# Patient Record
Sex: Female | Born: 1950 | Race: White | Hispanic: No | Marital: Married | Smoking: Former smoker
Health system: Southern US, Community
[De-identification: ages and names within clinical notes are randomized; demographics above are authoritative.]

## PROBLEM LIST (undated history)

## (undated) DIAGNOSIS — E785 Hyperlipidemia, unspecified: Secondary | ICD-10-CM

## (undated) DIAGNOSIS — Z860100 Personal history of colon polyps, unspecified: Secondary | ICD-10-CM

## (undated) DIAGNOSIS — E039 Hypothyroidism, unspecified: Secondary | ICD-10-CM

## (undated) DIAGNOSIS — M48062 Spinal stenosis, lumbar region with neurogenic claudication: Secondary | ICD-10-CM

## (undated) DIAGNOSIS — I10 Essential (primary) hypertension: Secondary | ICD-10-CM

## (undated) DIAGNOSIS — M1990 Unspecified osteoarthritis, unspecified site: Secondary | ICD-10-CM

## (undated) DIAGNOSIS — K219 Gastro-esophageal reflux disease without esophagitis: Secondary | ICD-10-CM

## (undated) DIAGNOSIS — Z8489 Family history of other specified conditions: Secondary | ICD-10-CM

## (undated) DIAGNOSIS — C801 Malignant (primary) neoplasm, unspecified: Secondary | ICD-10-CM

## (undated) DIAGNOSIS — K589 Irritable bowel syndrome without diarrhea: Secondary | ICD-10-CM

## (undated) DIAGNOSIS — K2270 Barrett's esophagus without dysplasia: Secondary | ICD-10-CM

## (undated) DIAGNOSIS — D099 Carcinoma in situ, unspecified: Secondary | ICD-10-CM

## (undated) HISTORY — PX: CHOLECYSTECTOMY: SHX55

## (undated) HISTORY — DX: Carcinoma in situ, unspecified: D09.9

## (undated) HISTORY — DX: Irritable bowel syndrome, unspecified: K58.9

## (undated) HISTORY — PX: OTHER SURGICAL HISTORY: SHX169

## (undated) HISTORY — DX: Unspecified osteoarthritis, unspecified site: M19.90

## (undated) HISTORY — DX: Personal history of colonic polyps: Z86.010

## (undated) HISTORY — DX: Personal history of colon polyps, unspecified: Z86.0100

## (undated) HISTORY — PX: TUBAL LIGATION: SHX77

## (undated) HISTORY — PX: APPENDECTOMY: SHX54

---

## 1988-08-06 HISTORY — PX: BREAST BIOPSY: SHX20

## 2006-09-10 ENCOUNTER — Ambulatory Visit: Payer: Self-pay | Admitting: Family Medicine

## 2007-08-19 HISTORY — PX: OTHER SURGICAL HISTORY: SHX169

## 2007-09-16 ENCOUNTER — Ambulatory Visit: Payer: Self-pay | Admitting: Family Medicine

## 2008-09-21 ENCOUNTER — Ambulatory Visit: Payer: Self-pay | Admitting: Family Medicine

## 2008-11-11 ENCOUNTER — Ambulatory Visit: Payer: Self-pay | Admitting: Unknown Physician Specialty

## 2009-10-13 ENCOUNTER — Ambulatory Visit: Payer: Self-pay | Admitting: Internal Medicine

## 2009-11-23 ENCOUNTER — Ambulatory Visit: Payer: Self-pay | Admitting: Internal Medicine

## 2010-11-30 ENCOUNTER — Ambulatory Visit: Payer: Self-pay | Admitting: Internal Medicine

## 2010-12-29 ENCOUNTER — Other Ambulatory Visit: Payer: Self-pay | Admitting: Internal Medicine

## 2011-05-22 ENCOUNTER — Ambulatory Visit: Payer: Self-pay | Admitting: Unknown Physician Specialty

## 2011-06-12 ENCOUNTER — Encounter: Payer: Self-pay | Admitting: Internal Medicine

## 2011-07-07 ENCOUNTER — Encounter: Payer: Self-pay | Admitting: Internal Medicine

## 2011-08-07 ENCOUNTER — Encounter: Payer: Self-pay | Admitting: Internal Medicine

## 2011-11-01 ENCOUNTER — Ambulatory Visit: Payer: Self-pay | Admitting: Gastroenterology

## 2011-12-21 ENCOUNTER — Ambulatory Visit: Payer: Self-pay | Admitting: Internal Medicine

## 2011-12-25 ENCOUNTER — Ambulatory Visit: Payer: Self-pay | Admitting: Internal Medicine

## 2012-07-10 ENCOUNTER — Ambulatory Visit: Payer: Self-pay | Admitting: Internal Medicine

## 2013-01-01 ENCOUNTER — Ambulatory Visit: Payer: Self-pay | Admitting: Internal Medicine

## 2013-04-13 ENCOUNTER — Encounter: Payer: Self-pay | Admitting: Family Medicine

## 2013-05-06 ENCOUNTER — Encounter: Payer: Self-pay | Admitting: Family Medicine

## 2013-05-06 ENCOUNTER — Ambulatory Visit: Payer: Self-pay | Admitting: Neurology

## 2013-05-06 LAB — CREATININE, SERUM: Creatinine: 0.77 mg/dL (ref 0.60–1.30)

## 2013-05-21 ENCOUNTER — Ambulatory Visit: Payer: Self-pay | Admitting: Family Medicine

## 2013-06-06 ENCOUNTER — Encounter: Payer: Self-pay | Admitting: Family Medicine

## 2013-07-06 ENCOUNTER — Encounter: Payer: Self-pay | Admitting: Family Medicine

## 2013-08-06 ENCOUNTER — Encounter: Payer: Self-pay | Admitting: Family Medicine

## 2014-01-07 ENCOUNTER — Ambulatory Visit: Payer: Self-pay | Admitting: Family Medicine

## 2014-12-13 ENCOUNTER — Other Ambulatory Visit: Payer: Self-pay

## 2014-12-13 DIAGNOSIS — Z1231 Encounter for screening mammogram for malignant neoplasm of breast: Secondary | ICD-10-CM

## 2015-01-12 ENCOUNTER — Ambulatory Visit
Admission: RE | Admit: 2015-01-12 | Discharge: 2015-01-12 | Disposition: A | Discharge status: Home / Self Care | Payer: Medicare Other | Source: Ambulatory Visit | Attending: Diagnostic Radiology | Admitting: Diagnostic Radiology

## 2015-01-12 DIAGNOSIS — Z1231 Encounter for screening mammogram for malignant neoplasm of breast: Secondary | ICD-10-CM | POA: Diagnosis present

## 2015-01-12 HISTORY — DX: Malignant (primary) neoplasm, unspecified: C80.1

## 2015-03-25 ENCOUNTER — Ambulatory Visit: Payer: Medicare Other | Attending: Orthopedic Surgery | Admitting: Physical Therapy

## 2015-03-25 DIAGNOSIS — M25562 Pain in left knee: Secondary | ICD-10-CM | POA: Insufficient documentation

## 2015-03-25 DIAGNOSIS — G8929 Other chronic pain: Secondary | ICD-10-CM

## 2015-03-25 DIAGNOSIS — M545 Low back pain, unspecified: Secondary | ICD-10-CM

## 2015-03-25 DIAGNOSIS — M6281 Muscle weakness (generalized): Secondary | ICD-10-CM

## 2015-03-25 NOTE — Therapy (Signed)
[facility] [address], [place], [address] Phone: [phone]   Fax:  [phone]  Physical Therapy Treatment  Patient Details  Name: Joy Hernandez MRN: [id number] Date of Birth: 08/23/1950 Referring Provider:  Thornton Park, MD  Encounter Date: 03/25/2015      PT End of Session - 03/25/15 0937    Visit Number 1   Number of Visits 9   Date for PT Re-Evaluation 05/07/15   Authorization Type gcode   Authorization - Visit Number 1   Authorization - Number of Visits 10   PT Start Time 0830   PT Stop Time 0076   PT Time Calculation (min) 67 min   Activity Tolerance Patient tolerated treatment well;No increased pain   Behavior During Therapy [facility] for tasks assessed/performed      Past Medical History  Diagnosis Date  . Cancer     appendix    Past Surgical History  Procedure Laterality Date  . Breast biopsy Left 1990    needle bx- neg    There were no vitals filed for this visit.  Visit Diagnosis:  Knee pain, chronic, left  Muscle weakness  Left-sided low back pain without sciatica      Subjective Assessment - 03/25/15 0833    Subjective Pt reports she has had chronic left knee pain for "years" which had been "nothing bad". Pain began when pt was doing lunges many years ago. Has had on and off again soreness which has worsened over the past year. Pain is behind knee. Pt has pain with getting up and down, sitting for long periods of time.    Limitations Sitting;Walking   How long can you sit comfortably? variable, as little as 5 min.   Diagnostic tests X-rays with mild B arthritis   Patient Stated Goals Pt wants a set of exercises and stretches to manage her symptoms at home.   Currently in Pain? No/denies            [facility] PT Assessment - 03/25/15 0001    Assessment   Medical Diagnosis pain in left knee   Hand Dominance Right   Prior Therapy chiropractic   Balance Screen   Has the  patient fallen in the past 6 months No   Has the patient had a decrease in activity level because of a fear of falling?  No   Is the patient reluctant to leave their home because of a fear of falling?  No   Prior Function   Level of Independence Independent   Vocation Full time employment   Vocation Requirements sitting, walking short to moderate distances, desk work   Leisure walking, working out, spending time with family   Posture/Postural Control   Posture Comments knee valgus B, worse on L. FHP, flat lumbar spine.   ROM / Strength   AROM / PROM / Strength AROM;PROM   AROM   Overall AROM Comments AROM: WNL except B hip ER decr., no pain, L knee extension painful at end range. Decr. strength in L LE to 4/5 most likely pain related except B hip abduction 3/5 with compensatory TFL engagement   PROM   Overall PROM Comments PROM for L knee flexion pain free.   Palpation   Patella mobility L glide painful, Tib/femur mob reproduces ant. pain.    Palpation comment trigger points noted throughout quad, adductor, glutes, QL.   Ambulation/Gait   Gait Comments B trendelenberg, decr. stance time on L.  Objective: Standing with education on wt shift to maintain centration through the joints. Leg lift and hold in this position x5 min.  SL leg lift 3x8 with manual correction to avoid compensation/focus on glute med strengthening.  Patellar mobs 3x30 grade IV med/lat. Tib/femur mobs same grades. Passive end range knee extension 3x20 with 5 sec. Holds.  Following manual tx pt had pain free knee extension in seated.                   PT Education - April 03, 2015 0936    Education provided Yes   Education Details educated pt on HEP (see note)   Person(s) Educated Patient   Methods Explanation;Demonstration;Tactile cues;Verbal cues   Comprehension Verbalized understanding;Returned demonstration;Verbal cues required;Tactile cues required;Need further instruction              PT Long Term Goals - 04/03/15 0940    PT LONG TERM GOAL #1   Title Pt will be able to sit 30 min with no c/o pain in knee to participate in work activities.   Baseline 5 min   Time 6   Period Weeks   Status New   PT LONG TERM GOAL #2   Title Pt will improve MMT 1/2 step from 3/5 to 3+/5 for glute med to decr. compensatory knee strain.   Time 6   Period Weeks   Status New   PT LONG TERM GOAL #3   Title Pt will be able to walk 20 min pain free to improve functional capacity.   Baseline 5 min   Time 6   Period Weeks   Status New               Plan - April 03, 2015 6073    Clinical Impression Statement pt is a pleasant 64 y/o female with c/o chronic L knee pain. Currently pt presents with B trendelenberg gait, muscle weakness in B glute med, hypomobility and edema in L knee, pain and muscle weakness in same region, as well as altered gait. Pt requestes to be issued HEP to make progress on her own so focus of sessions will be on addressing these issues via exercise and stretching but pt may benefit from some manual therapy and dry needling.   Pt will benefit from skilled therapeutic intervention in order to improve on the following deficits Pain;Postural dysfunction;Improper body mechanics;Impaired flexibility;Decreased range of motion;Decreased mobility;Difficulty walking;Decreased strength   Rehab Potential Good   Clinical Impairments Affecting Rehab Potential dizziness, chronic pain.   PT Frequency 1x / week   PT Duration 6 weeks   PT Treatment/Interventions ADLs/Self Care Home Management;Passive range of motion;Therapeutic activities;Therapeutic exercise;Dry needling;Manual techniques   PT Next Visit Plan assess back   Consulted and Agree with Plan of Care Patient          G-Codes - 04-03-15 0942    Functional Assessment Tool Used NPRS, sitting time   Functional Limitation Mobility: Walking and moving around   Mobility: Walking and Moving Around Current Status  ([id number]) At least 1 percent but less than 20 percent impaired, limited or restricted   Mobility: Walking and Moving Around Goal Status [phone]) At least 1 percent but less than 20 percent impaired, limited or restricted      Problem List There are no active problems to display for this patient.   Misty Foutz 2015/04/03, 9:44 AM  [place] [facility] [address] [place], [address] Phone: [phone]   Fax:  [phone]      

## 2015-03-31 ENCOUNTER — Ambulatory Visit
Admission: RE | Admit: 2015-03-31 | Discharge: 2015-03-31 | Disposition: A | Discharge status: Home / Self Care | Payer: Medicare Other | Source: Ambulatory Visit | Attending: Gastroenterology | Admitting: Gastroenterology

## 2015-03-31 ENCOUNTER — Ambulatory Visit: Payer: Medicare Other | Admitting: Anesthesiology

## 2015-03-31 ENCOUNTER — Encounter: Payer: Self-pay | Admitting: *Deleted

## 2015-03-31 ENCOUNTER — Encounter: Payer: Medicare Other | Admitting: Physical Therapy

## 2015-03-31 ENCOUNTER — Encounter
Admission: RE | Disposition: A | Discharge status: Home / Self Care | Payer: Self-pay | Source: Ambulatory Visit | Attending: Gastroenterology

## 2015-03-31 DIAGNOSIS — I10 Essential (primary) hypertension: Secondary | ICD-10-CM | POA: Diagnosis not present

## 2015-03-31 DIAGNOSIS — E785 Hyperlipidemia, unspecified: Secondary | ICD-10-CM | POA: Diagnosis not present

## 2015-03-31 DIAGNOSIS — Z8589 Personal history of malignant neoplasm of other organs and systems: Secondary | ICD-10-CM | POA: Insufficient documentation

## 2015-03-31 DIAGNOSIS — Z79899 Other long term (current) drug therapy: Secondary | ICD-10-CM | POA: Diagnosis not present

## 2015-03-31 DIAGNOSIS — Z7982 Long term (current) use of aspirin: Secondary | ICD-10-CM | POA: Insufficient documentation

## 2015-03-31 DIAGNOSIS — Z803 Family history of malignant neoplasm of breast: Secondary | ICD-10-CM | POA: Diagnosis not present

## 2015-03-31 DIAGNOSIS — F17210 Nicotine dependence, cigarettes, uncomplicated: Secondary | ICD-10-CM | POA: Diagnosis not present

## 2015-03-31 DIAGNOSIS — K2270 Barrett's esophagus without dysplasia: Secondary | ICD-10-CM | POA: Diagnosis not present

## 2015-03-31 DIAGNOSIS — K219 Gastro-esophageal reflux disease without esophagitis: Secondary | ICD-10-CM | POA: Insufficient documentation

## 2015-03-31 HISTORY — DX: Barrett's esophagus without dysplasia: K22.70

## 2015-03-31 HISTORY — DX: Essential (primary) hypertension: I10

## 2015-03-31 HISTORY — DX: Gastro-esophageal reflux disease without esophagitis: K21.9

## 2015-03-31 HISTORY — DX: Hyperlipidemia, unspecified: E78.5

## 2015-03-31 HISTORY — PX: ESOPHAGOGASTRODUODENOSCOPY: SHX5428

## 2015-03-31 SURGERY — EGD (ESOPHAGOGASTRODUODENOSCOPY)
Anesthesia: General

## 2015-03-31 MED ORDER — BUTAMBEN-TETRACAINE-BENZOCAINE 2-2-14 % EX AERO
INHALATION_SPRAY | CUTANEOUS | Status: DC | PRN
  Administered 2015-03-31: 1 via TOPICAL

## 2015-03-31 MED ORDER — MIDAZOLAM HCL 5 MG/5ML IJ SOLN
INTRAMUSCULAR | Status: AC
  Filled 2015-03-31: qty 10

## 2015-03-31 MED ORDER — MIDAZOLAM HCL 5 MG/5ML IJ SOLN
INTRAMUSCULAR | Status: DC | PRN
  Administered 2015-03-31 (×2): 2 mg via INTRAVENOUS

## 2015-03-31 MED ORDER — SODIUM CHLORIDE 0.9 % IV SOLN
INTRAVENOUS | Status: DC
Start: 2015-03-31 — End: 2015-03-31

## 2015-03-31 MED ORDER — FENTANYL CITRATE (PF) 100 MCG/2ML IJ SOLN
INTRAMUSCULAR | Status: AC
  Filled 2015-03-31: qty 4

## 2015-03-31 MED ORDER — FENTANYL CITRATE (PF) 100 MCG/2ML IJ SOLN
INTRAMUSCULAR | Status: DC | PRN
  Administered 2015-03-31 (×2): 50 ug via INTRAVENOUS

## 2015-03-31 MED ORDER — SODIUM CHLORIDE 0.9 % IV SOLN
INTRAVENOUS | Status: DC
Start: 2015-03-31 — End: 2015-03-31
  Administered 2015-03-31: 1000 mL via INTRAVENOUS

## 2015-03-31 NOTE — Anesthesia Preprocedure Evaluation (Deleted)
Anesthesia Evaluation  Patient identified by MRN, date of birth, ID band Patient awake    Reviewed: Allergy & Precautions, NPO status , Patient's Chart, lab work & pertinent test results, reviewed documented beta blocker date and time   Airway Mallampati: II  TM Distance: >3 FB     Dental  (+) Chipped   Pulmonary          Cardiovascular hypertension, Pt. on medications     Neuro/Psych    GI/Hepatic   Endo/Other    Renal/GU      Musculoskeletal   Abdominal   Peds  Hematology   Anesthesia Other Findings   Reproductive/Obstetrics                             Anesthesia Physical Anesthesia Plan  ASA: II  Anesthesia Plan: General   Post-op Pain Management:    Induction: Intravenous  Airway Management Planned: Nasal Cannula  Additional Equipment:   Intra-op Plan:   Post-operative Plan:   Informed Consent: I have reviewed the patients History and Physical, chart, labs and discussed the procedure including the risks, benefits and alternatives for the proposed anesthesia with the patient or authorized representative who has indicated his/her understanding and acceptance.     Plan Discussed with: CRNA  Anesthesia Plan Comments:         Anesthesia Quick Evaluation

## 2015-03-31 NOTE — Op Note (Signed)
[facility] Gastroenterology Patient Name: Joy Hernandez Procedure Date: 03/31/2015 7:29 AM MRN: [id number] Account #: [id number] Date of Birth: Apr 17, 1951 Admit Type: Outpatient Age: 64 Room: [facility] ENDO ROOM 4 Gender: Female Note Status: Finalized Procedure:         Upper GI endoscopy Indications:       Follow-up of Barrett's esophagus Providers:         Lupita Dawn. Candace Cruise, MD Referring MD:      Caprice Renshaw (Referring MD) Medicines:         Midazolam 4 mg IV, Fentanyl 100 micrograms IV, Cetacaine                     spray Complications:     No immediate complications. Procedure:         Pre-Anesthesia Assessment:                    - Prior to the procedure, a History and Physical was                     performed, and patient medications, allergies and                     sensitivities were reviewed. The patient's tolerance of                     previous anesthesia was reviewed.                    - The risks and benefits of the procedure and the sedation                     options and risks were discussed with the patient. All                     questions were answered and informed consent was obtained.                    - After reviewing the risks and benefits, the patient was                     deemed in satisfactory condition to undergo the procedure.                    After obtaining informed consent, the endoscope was passed                     under direct vision. Throughout the procedure, the                     patient's blood pressure, pulse, and oxygen saturations                     were monitored continuously. The Olympus GIF-160 endoscope                     (S#. [id number]) was introduced through the mouth, and                     advanced to the second part of duodenum. The upper GI                     endoscopy was accomplished without difficulty. The patient  tolerated the procedure well. Findings:      The examined esophagus  was normal. Biopsies were taken with a cold       forceps for histology.      The entire examined stomach was normal.      The examined duodenum was normal. Impression:        - Normal esophagus. Biopsied.                    - Normal stomach.                    - Normal examined duodenum. Recommendation:    - Discharge patient to home.                    - Observe patient's clinical course.                    - The findings and recommendations were discussed with the                     patient.                    - Await pathology results.                    - Continue present medications. Procedure Code(s): --- Professional ---                    [phone], Esophagogastroduodenoscopy, flexible, transoral;                     with biopsy, single or multiple Diagnosis Code(s): --- Professional ---                    K22.70, Barrett's esophagus without dysplasia CPT copyright 2014 American Medical Association. All rights reserved. The codes documented in this report are preliminary and upon coder review may  be revised to meet current compliance requirements. Hulen Luster, MD 03/31/2015 8:24:48 AM This report has been signed electronically. Number of Addenda: 0 Note Initiated On: 03/31/2015 7:29 AM      [facility]

## 2015-03-31 NOTE — H&P (Signed)
Primary Care Physician:  Marcello Fennel, MD Primary Gastroenterologist:  Dr. Candace Cruise  Pre-Procedure History & Physical: HPI:  Joy Hernandez is a 64 y.o. female is here for an EGD.   Past Medical History  Diagnosis Date  . Hypertension   . Hyperlipidemia   . GERD (gastroesophageal reflux disease)   . Barrett's esophagus   . Cancer     appendix    Past Surgical History  Procedure Laterality Date  . Breast biopsy Left 1990    needle bx- neg    Prior to Admission medications   Medication Sig Start Date End Date Taking? Authorizing Provider  Ascorbic Acid (VITAMIN C) 1000 MG tablet Take 1,000 mg by mouth daily.   Yes Historical Provider, MD  aspirin 81 MG tablet Take 81 mg by mouth daily.   Yes Historical Provider, MD  bisoprolol-hydrochlorothiazide (ZIAC) 2.5-6.25 MG per tablet Take 1 tablet by mouth daily.   Yes Historical Provider, MD  Fish Oil-Cholecalciferol (FISH OIL + D3 PO) Take by mouth.   Yes Historical Provider, MD  Multiple Vitamin (MULTIVITAMIN) tablet Take 1 tablet by mouth daily.   Yes Historical Provider, MD  Turmeric 500 MG CAPS Take by mouth.   Yes Historical Provider, MD  vitamin B-12 (CYANOCOBALAMIN) 1000 MCG tablet Take 1,000 mcg by mouth daily.   Yes Historical Provider, MD  Vitamin D, Ergocalciferol, (DRISDOL) 50000 UNITS CAPS capsule Take 50,000 Units by mouth every 7 (seven) days.   Yes Historical Provider, MD  escitalopram (LEXAPRO) 10 MG tablet Take 10 mg by mouth daily.    Historical Provider, MD  pantoprazole (PROTONIX) 40 MG tablet Take 40 mg by mouth daily.    Historical Provider, MD  simvastatin (ZOCOR) 40 MG tablet Take 40 mg by mouth daily.    Historical Provider, MD    Allergies as of 01/12/2015  . (Not on File)    Family History  Problem Relation Age of Onset  . Breast cancer Sister 48    Social History   Social History  . Marital Status: Married    Spouse Name: N/A  . Number of Children: N/A  . Years of Education: N/A    Occupational History  . Not on file.   Social History Main Topics  . Smoking status: Current Every Day Smoker  . Smokeless tobacco: Not on file  . Alcohol Use: Not on file  . Drug Use: Not on file  . Sexual Activity: Not on file   Other Topics Concern  . Not on file   Social History Narrative    Review of Systems: See HPI, otherwise negative ROS  Physical Exam: BP 164/67 mmHg  Pulse 61  Temp(Src) 96.1 F (35.6 C) (Tympanic)  Resp 20  Ht 5\' 4"  (1.626 m)  Wt 94.348 kg (208 lb)  BMI 35.69 kg/m2  SpO2 99% General:   Alert,  pleasant and cooperative in NAD Head:  Normocephalic and atraumatic. Neck:  Supple; no masses or thyromegaly. Lungs:  Clear throughout to auscultation.    Heart:  Regular rate and rhythm. Abdomen:  Soft, nontender and nondistended. Normal bowel sounds, without guarding, and without rebound.   Neurologic:  Alert and  oriented x4;  grossly normal neurologically.  Impression/Plan: Joy Hernandez is here for an EGD to be performed for hx of Barrett's. Risks, benefits, limitations, and alternatives regarding EGD have been reviewed with the patient.  Questions have been answered.  All parties agreeable.   Harvie Morua, Lupita Dawn, MD  03/31/2015, 7:58 AM

## 2015-04-01 ENCOUNTER — Ambulatory Visit: Payer: Medicare Other | Admitting: Physical Therapy

## 2015-04-01 ENCOUNTER — Encounter: Payer: Self-pay | Admitting: Gastroenterology

## 2015-04-01 DIAGNOSIS — M25562 Pain in left knee: Secondary | ICD-10-CM | POA: Diagnosis not present

## 2015-04-01 DIAGNOSIS — M6281 Muscle weakness (generalized): Secondary | ICD-10-CM

## 2015-04-01 DIAGNOSIS — G8929 Other chronic pain: Secondary | ICD-10-CM

## 2015-04-01 LAB — SURGICAL PATHOLOGY

## 2015-04-01 NOTE — Therapy (Signed)
[facility] [address] [place], [address] Phone: [phone]   Fax:  [phone]  Physical Therapy Treatment  Patient Details  Name: Joy Hernandez MRN: [id number] Date of Birth: [birth year] Referring Provider:  Thornton Park, MD  Encounter Date: 04/01/2015      PT End of Session - 04/01/15 1116    Visit Number 2   Number of Visits 9   Date for PT Re-Evaluation 05/07/15   Authorization Type gcode   PT Start Time 1115   PT Stop Time 1200   PT Time Calculation (min) 45 min   Activity Tolerance --   Behavior During Therapy --      Past Medical History  Diagnosis Date  . Hypertension   . Hyperlipidemia   . GERD (gastroesophageal reflux disease)   . Barrett's esophagus   . Cancer     appendix    Past Surgical History  Procedure Laterality Date  . Breast biopsy Left 1990    needle bx- neg    There were no vitals filed for this visit.  Visit Diagnosis:  Knee pain, chronic, left  Muscle weakness      Subjective Assessment - 04/01/15 1110    Subjective Pt reports decr. pain. "the only time it hurts is when I bend it back". Pt reports she is doing her exercises.   Limitations Sitting;Walking   How long can you sit comfortably? variable, as little as 5 min.   Diagnostic tests X-rays with mild B arthritis   Patient Stated Goals Pt wants a set of exercises and stretches to manage her symptoms at home.   Currently in Pain? No/denies                    Objective: Reviewed and corrected TA contraction with SLR.  Heel slide with overpressure to facilitate knee flexion 3x10.  Standing calf stretch to decr. Lateral posterior chain tightness 3x1 min on L.  Ball knee oxcillations in pain free state x5 min.  glute set 3x10 with 3 sec. Hold.  glute set with kegel 3x5 with 3 sec. Hold.  Exhale with pelvic activation to decr. Compensatory back activation.  Pt required extensive  cuing with exercises, following session pt reported 0/10 pain but had questions regarding performing these exercises at home, PT will follow up at next session with this.             PT Education - 04/01/15 1115    Education provided Yes   Education Details educated on self quad stretch in prone.   Person(s) Educated Patient   Methods Explanation;Demonstration;Tactile cues;Verbal cues   Comprehension Verbalized understanding;Returned demonstration;Verbal cues required;Tactile cues required;Need further instruction             PT Long Term Goals - 03/25/15 0940    PT LONG TERM GOAL #1   Title Pt will be able to sit 30 min with no c/o pain in knee to participate in work activities.   Baseline 5 min   Time 6   Period Weeks   Status New   PT LONG TERM GOAL #2   Title Pt will improve MMT 1/2 step from 3/5 to 3+/5 for glute med to decr. compensatory knee strain.   Time 6   Period Weeks   Status New   PT LONG TERM GOAL #3   Title Pt will be able to walk 20 min pain free to improve functional capacity.   Baseline  5 min   Time 6   Period Weeks   Status New               Plan - 04/01/15 1116    Clinical Impression Statement Pt has complex orthopedic problem, reported today that in addition to her knee pain she also has pain in lateral gastroc, occasional L sided back pain and L plantar fascitis as well as urinary incontinence issues. Overall pain has improved but pt continues to have mild L sided pain and poor functional control globally on L side.   Pt will benefit from skilled therapeutic intervention in order to improve on the following deficits Pain;Postural dysfunction;Improper body mechanics;Impaired flexibility;Decreased range of motion;Decreased mobility;Difficulty walking;Decreased strength   Rehab Potential Good   Clinical Impairments Affecting Rehab Potential dizziness, chronic pain.   PT Frequency 1x / week   PT Duration 6 weeks   PT  Treatment/Interventions ADLs/Self Care Home Management;Passive range of motion;Therapeutic activities;Therapeutic exercise;Dry needling;Manual techniques        Problem List There are no active problems to display for this patient.   Belvin Gauss 04/01/2015, 12:04 PM  [place] [facility] [address], [place], [address] Phone: [phone]   Fax:  [phone]

## 2015-04-06 ENCOUNTER — Encounter: Payer: Medicare Other | Admitting: Physical Therapy

## 2015-04-08 ENCOUNTER — Ambulatory Visit: Payer: Medicare Other | Attending: Orthopedic Surgery | Admitting: Physical Therapy

## 2015-04-08 DIAGNOSIS — M25562 Pain in left knee: Secondary | ICD-10-CM | POA: Diagnosis not present

## 2015-04-08 DIAGNOSIS — G8929 Other chronic pain: Secondary | ICD-10-CM | POA: Insufficient documentation

## 2015-04-08 DIAGNOSIS — M6281 Muscle weakness (generalized): Secondary | ICD-10-CM | POA: Diagnosis present

## 2015-04-08 NOTE — Therapy (Signed)
[place] [facility] [address] [place], [address] Phone: [phone]   Fax:  [phone]  Physical Therapy Treatment  Patient Details  Name: Joy Hernandez MRN: [id number] Date of Birth: 11-03-1950 Referring Provider:  Thornton Park, MD  Encounter Date: 04/08/2015      PT End of Session - 04/08/15 0925    Visit Number 3   Number of Visits 9   Date for PT Re-Evaluation 05/07/15   Authorization Type gcode   Authorization - Visit Number 3   Authorization - Number of Visits 10   PT Start Time 0745   PT Stop Time 0830   PT Time Calculation (min) 45 min   Activity Tolerance Patient tolerated treatment well;No increased pain   Behavior During Therapy [facility] for tasks assessed/performed      Past Medical History  Diagnosis Date  . Hypertension   . Hyperlipidemia   . GERD (gastroesophageal reflux disease)   . Barrett's esophagus   . Cancer     appendix    Past Surgical History  Procedure Laterality Date  . Breast biopsy Left 1990    needle bx- neg  . Esophagogastroduodenoscopy N/A 03/31/2015    Procedure: ESOPHAGOGASTRODUODENOSCOPY (EGD);  Surgeon: Hulen Luster, MD;  Location: [facility] ENDOSCOPY;  Service: Gastroenterology;  Laterality: N/A;    There were no vitals filed for this visit.  Visit Diagnosis:  Knee pain, chronic, left  Muscle weakness      Subjective Assessment - 04/08/15 0743    Subjective Pt reports both knees are stiff today. overall she is doing very well and has had no pain since prior to prevoius session.   Currently in Pain? No/denies   Pain Score 0-No pain            Objective: Stairs knee flexion stretch performed on 2 levels, 3x1 min on each side at each level.  TG 3x8 squats on highest level, cuing to maintain even weight shift.  Nu-step x3 min to assess for performance/pain, L1   Recumbent bike x3 min to assess for same, no resistance.  PT educated pt on use of nu-step  at local gym for continued improvement in function, strength and to decr. Pain.  SL leg lift  3x8. Pt required cuing to avoid rolling back and compensating with hip flexors.  Patellar lateral glides 3x1 min grade IV, tib/femur glides also 3x1 min grade IV. Performed on both sides due to stiffness B today - pt reported no stiffness following session.                     PT Education - 04/08/15 [phone]    Education provided Yes   Education Details educated pt on exercise equipment, knee stretch.   Person(s) Educated Patient   Methods Explanation;Demonstration;Tactile cues;Verbal cues   Comprehension Verbalized understanding;Returned demonstration;Verbal cues required             PT Long Term Goals - 03/25/15 0940    PT LONG TERM GOAL #1   Title Pt will be able to sit 30 min with no c/o pain in knee to participate in work activities.   Baseline 5 min   Time 6   Period Weeks   Status New   PT LONG TERM GOAL #2   Title Pt will improve MMT 1/2 step from 3/5 to 3+/5 for glute med to decr. compensatory knee strain.   Time 6   Period Weeks   Status New  PT LONG TERM GOAL #3   Title Pt will be able to walk 20 min pain free to improve functional capacity.   Baseline 5 min   Time 6   Period Weeks   Status New               Plan - 04/08/15 8756    Clinical Impression Statement Pt has made improvement, is I with her HEP and appears to be capable of maintaining pain free state with self performance of stretches/exercise. Pt to be seen for one additional followup session to progress glute strengthening as she is still weak in this area and would benefit from additional PT to address this.   Pt will benefit from skilled therapeutic intervention in order to improve on the following deficits Pain;Postural dysfunction;Improper body mechanics;Impaired flexibility;Decreased range of motion;Decreased mobility;Difficulty walking;Decreased strength        Problem  List There are no active problems to display for this patient.   Fisher,Benjamin 04/08/2015, 9:29 AM  [place] [facility] [address], [place], [address] Phone: [phone]   Fax:  [phone]

## 2015-04-13 ENCOUNTER — Encounter: Payer: Medicare Other | Admitting: Physical Therapy

## 2015-04-14 ENCOUNTER — Encounter: Payer: Medicare Other | Admitting: Physical Therapy

## 2015-04-19 ENCOUNTER — Encounter: Payer: Medicare Other | Admitting: Physical Therapy

## 2015-04-22 ENCOUNTER — Encounter: Payer: Medicare Other | Admitting: Physical Therapy

## 2016-01-26 ENCOUNTER — Other Ambulatory Visit: Payer: Self-pay | Admitting: Family Medicine

## 2016-01-26 DIAGNOSIS — Z1231 Encounter for screening mammogram for malignant neoplasm of breast: Secondary | ICD-10-CM

## 2016-02-15 ENCOUNTER — Ambulatory Visit: Payer: Medicare Other

## 2016-03-28 ENCOUNTER — Other Ambulatory Visit: Payer: Self-pay | Admitting: Family Medicine

## 2016-03-28 ENCOUNTER — Ambulatory Visit
Admission: RE | Admit: 2016-03-28 | Discharge: 2016-03-28 | Disposition: A | Discharge status: Home / Self Care | Payer: Medicare Other | Source: Ambulatory Visit | Attending: Family Medicine | Admitting: Family Medicine

## 2016-03-28 DIAGNOSIS — Z1231 Encounter for screening mammogram for malignant neoplasm of breast: Secondary | ICD-10-CM | POA: Insufficient documentation

## 2017-02-21 ENCOUNTER — Other Ambulatory Visit: Payer: Self-pay | Admitting: Family Medicine

## 2017-02-21 DIAGNOSIS — Z1231 Encounter for screening mammogram for malignant neoplasm of breast: Secondary | ICD-10-CM

## 2017-05-13 ENCOUNTER — Ambulatory Visit
Admission: RE | Admit: 2017-05-13 | Discharge: 2017-05-13 | Disposition: A | Discharge status: Home / Self Care | Payer: Medicare Other | Source: Ambulatory Visit | Attending: Family Medicine | Admitting: Family Medicine

## 2017-05-13 DIAGNOSIS — Z1231 Encounter for screening mammogram for malignant neoplasm of breast: Secondary | ICD-10-CM | POA: Diagnosis present

## 2018-04-14 ENCOUNTER — Other Ambulatory Visit: Payer: Self-pay | Admitting: Family Medicine

## 2018-04-14 DIAGNOSIS — Z1231 Encounter for screening mammogram for malignant neoplasm of breast: Secondary | ICD-10-CM

## 2018-05-20 ENCOUNTER — Ambulatory Visit
Admission: RE | Admit: 2018-05-20 | Discharge: 2018-05-20 | Disposition: A | Discharge status: Home / Self Care | Payer: Medicare HMO | Source: Ambulatory Visit | Attending: Family Medicine | Admitting: Family Medicine

## 2018-05-20 ENCOUNTER — Encounter (INDEPENDENT_AMBULATORY_CARE_PROVIDER_SITE_OTHER): Payer: Self-pay

## 2018-05-20 DIAGNOSIS — Z1231 Encounter for screening mammogram for malignant neoplasm of breast: Secondary | ICD-10-CM | POA: Diagnosis not present

## 2018-09-01 ENCOUNTER — Encounter: Payer: Self-pay | Admitting: *Deleted

## 2018-09-02 ENCOUNTER — Ambulatory Visit: Payer: Medicare HMO | Admitting: Anesthesiology

## 2018-09-02 ENCOUNTER — Encounter: Payer: Self-pay | Admitting: *Deleted

## 2018-09-02 ENCOUNTER — Ambulatory Visit
Admission: RE | Admit: 2018-09-02 | Discharge: 2018-09-02 | Disposition: A | Discharge status: Home / Self Care | Payer: Medicare HMO | Attending: Internal Medicine | Admitting: Internal Medicine

## 2018-09-02 ENCOUNTER — Encounter
Admission: RE | Disposition: A | Discharge status: Home / Self Care | Payer: Self-pay | Source: Home / Self Care | Attending: Internal Medicine

## 2018-09-02 DIAGNOSIS — K2980 Duodenitis without bleeding: Secondary | ICD-10-CM | POA: Diagnosis not present

## 2018-09-02 DIAGNOSIS — K2950 Unspecified chronic gastritis without bleeding: Secondary | ICD-10-CM | POA: Diagnosis not present

## 2018-09-02 DIAGNOSIS — K640 First degree hemorrhoids: Secondary | ICD-10-CM | POA: Insufficient documentation

## 2018-09-02 DIAGNOSIS — Z86010 Personal history of colonic polyps: Secondary | ICD-10-CM | POA: Diagnosis not present

## 2018-09-02 DIAGNOSIS — D120 Benign neoplasm of cecum: Secondary | ICD-10-CM | POA: Diagnosis not present

## 2018-09-02 DIAGNOSIS — I10 Essential (primary) hypertension: Secondary | ICD-10-CM | POA: Insufficient documentation

## 2018-09-02 DIAGNOSIS — K5730 Diverticulosis of large intestine without perforation or abscess without bleeding: Secondary | ICD-10-CM | POA: Insufficient documentation

## 2018-09-02 DIAGNOSIS — Z79899 Other long term (current) drug therapy: Secondary | ICD-10-CM | POA: Diagnosis not present

## 2018-09-02 DIAGNOSIS — E785 Hyperlipidemia, unspecified: Secondary | ICD-10-CM | POA: Diagnosis not present

## 2018-09-02 DIAGNOSIS — Z7951 Long term (current) use of inhaled steroids: Secondary | ICD-10-CM | POA: Insufficient documentation

## 2018-09-02 DIAGNOSIS — Z7982 Long term (current) use of aspirin: Secondary | ICD-10-CM | POA: Insufficient documentation

## 2018-09-02 DIAGNOSIS — K3189 Other diseases of stomach and duodenum: Secondary | ICD-10-CM | POA: Insufficient documentation

## 2018-09-02 DIAGNOSIS — K219 Gastro-esophageal reflux disease without esophagitis: Secondary | ICD-10-CM | POA: Diagnosis not present

## 2018-09-02 DIAGNOSIS — K449 Diaphragmatic hernia without obstruction or gangrene: Secondary | ICD-10-CM | POA: Insufficient documentation

## 2018-09-02 DIAGNOSIS — Z87891 Personal history of nicotine dependence: Secondary | ICD-10-CM | POA: Diagnosis not present

## 2018-09-02 DIAGNOSIS — K2270 Barrett's esophagus without dysplasia: Secondary | ICD-10-CM | POA: Diagnosis present

## 2018-09-02 HISTORY — PX: ESOPHAGOGASTRODUODENOSCOPY (EGD) WITH PROPOFOL: SHX5813

## 2018-09-02 HISTORY — PX: COLONOSCOPY WITH PROPOFOL: SHX5780

## 2018-09-02 SURGERY — ESOPHAGOGASTRODUODENOSCOPY (EGD) WITH PROPOFOL
Anesthesia: General

## 2018-09-02 MED ORDER — FENTANYL CITRATE (PF) 100 MCG/2ML IJ SOLN
INTRAMUSCULAR | Status: DC | PRN
  Administered 2018-09-02 (×4): 25 ug via INTRAVENOUS

## 2018-09-02 MED ORDER — PROPOFOL 10 MG/ML IV BOLUS
INTRAVENOUS | Status: DC | PRN
  Administered 2018-09-02: 50 mg via INTRAVENOUS
  Administered 2018-09-02: 20 mg via INTRAVENOUS

## 2018-09-02 MED ORDER — PROPOFOL 500 MG/50ML IV EMUL
INTRAVENOUS | Status: DC | PRN
  Administered 2018-09-02: 100 ug/kg/min via INTRAVENOUS

## 2018-09-02 MED ORDER — MIDAZOLAM HCL 2 MG/2ML IJ SOLN
INTRAMUSCULAR | Status: AC
  Filled 2018-09-02: qty 2

## 2018-09-02 MED ORDER — SODIUM CHLORIDE 0.9 % IV SOLN
INTRAVENOUS | Status: DC
  Administered 2018-09-02: 09:00:00 via INTRAVENOUS
  Administered 2018-09-02: 1000 mL via INTRAVENOUS

## 2018-09-02 MED ORDER — MIDAZOLAM HCL 2 MG/2ML IJ SOLN
INTRAMUSCULAR | Status: DC | PRN
  Administered 2018-09-02: 2 mg via INTRAVENOUS

## 2018-09-02 MED ORDER — FENTANYL CITRATE (PF) 100 MCG/2ML IJ SOLN
INTRAMUSCULAR | Status: AC
  Filled 2018-09-02: qty 2

## 2018-09-02 NOTE — Anesthesia Postprocedure Evaluation (Signed)
Anesthesia Post Note  Patient: Joy Hernandez  Procedure(s) Performed: ESOPHAGOGASTRODUODENOSCOPY (EGD) WITH PROPOFOL (N/A ) COLONOSCOPY WITH PROPOFOL (N/A )  Patient location during evaluation: Endoscopy Anesthesia Type: General Level of consciousness: awake and alert and oriented Pain management: pain level controlled Vital Signs Assessment: post-procedure vital signs reviewed and stable Respiratory status: spontaneous breathing, nonlabored ventilation and respiratory function stable Cardiovascular status: blood pressure returned to baseline and stable Postop Assessment: no signs of nausea or vomiting Anesthetic complications: no     Last Vitals:  Vitals:   09/02/18 0932 09/02/18 0941  BP: 137/75 105/70  Pulse:    Resp: 16   Temp:    SpO2:  95%    Last Pain:  Vitals:   09/02/18 0943  TempSrc:   PainSc: 0-No pain                 Zain Lankford

## 2018-09-02 NOTE — Anesthesia Preprocedure Evaluation (Signed)
Anesthesia Evaluation  Patient identified by MRN, date of birth, ID band Patient awake    Reviewed: Allergy & Precautions, NPO status , Patient's Chart, lab work & pertinent test results  History of Anesthesia Complications Negative for: history of anesthetic complications  Airway Mallampati: III  TM Distance: >3 FB Neck ROM: Full    Dental no notable dental hx.    Pulmonary neg sleep apnea, neg COPD, former smoker,    breath sounds clear to auscultation- rhonchi (-) wheezing      Cardiovascular hypertension, Pt. 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 No date: GERD (gastroesophageal reflux disease) No date: Hyperlipidemia No date: Hypertension   Reproductive/Obstetrics                             Anesthesia Physical Anesthesia Plan  ASA: III  Anesthesia Plan: General   Post-op Pain Management:    Induction: Intravenous  PONV Risk Score and Plan: 2 and Propofol infusion  Airway Management Planned: Natural Airway  Additional Equipment:   Intra-op Plan:   Post-operative Plan:   Informed Consent: I have reviewed the patients History and Physical, chart, labs and discussed the procedure including the risks, benefits and alternatives for the proposed anesthesia with the patient or authorized representative who has indicated his/her understanding and  acceptance.     Dental advisory given  Plan Discussed with: CRNA and Anesthesiologist  Anesthesia Plan Comments:         Anesthesia Quick Evaluation

## 2018-09-02 NOTE — Interval H&P Note (Signed)
History and Physical Interval Note:  09/02/2018 8:34 AM  Joy Hernandez  has presented today for surgery, with the diagnosis of History of Barrett's Esophagus PH Colon Polyps  The various methods of treatment have been discussed with the patient and family. After consideration of risks, benefits and other options for treatment, the patient has consented to  Procedure(s): ESOPHAGOGASTRODUODENOSCOPY (EGD) WITH PROPOFOL (N/A) COLONOSCOPY WITH PROPOFOL (N/A) as a surgical intervention .  The patient's history has been reviewed, patient examined, no change in status, stable for surgery.  I have reviewed the patient's chart and labs.  Questions were answered to the patient's satisfaction.     [place], [place]

## 2018-09-02 NOTE — Op Note (Signed)
[facility] Gastroenterology Patient Name: Joy Hernandez Procedure Date: 09/02/2018 8:38 AM MRN: [id number] Account #: [id number] Date of Birth: [birth year] Admit Type: Outpatient Age: 68 Room: [facility] ENDO ROOM 2 Gender: Female Note Status: Finalized Procedure:            Colonoscopy Indications:          High risk colon cancer surveillance: Personal history                        of colonic polyps Providers:            Benay Pike. [place] MD, MD Medicines:            Propofol per Anesthesia Complications:        No immediate complications. Procedure:            Pre-Anesthesia Assessment:                       - The risks and benefits of the procedure and the                        sedation options and risks were discussed with the                        patient. All questions were answered and informed                        consent was obtained.                       - Patient identification and proposed procedure were                        verified prior to the procedure by the nurse. The                        procedure was verified in the procedure room.                       - ASA Grade Assessment: III - A patient with severe                        systemic disease.                       - After reviewing the risks and benefits, the patient                        was deemed in satisfactory condition to undergo the                        procedure.                       After obtaining informed consent, the colonoscope was                        passed under direct vision. Throughout the procedure,                        the patient's blood pressure, pulse, and oxygen  saturations were monitored continuously. The                        Colonoscope was introduced through the anus and                        advanced to the the cecum, identified by appendiceal                        orifice and ileocecal valve. The colonoscopy was            performed without difficulty. The patient tolerated the                        procedure well. The quality of the bowel preparation                        was excellent. The ileocecal valve, appendiceal                        orifice, and rectum were photographed. Findings:      The perianal and digital rectal examinations were normal. Pertinent       negatives include normal sphincter tone and no palpable rectal lesions.      A few small-mouthed diverticula were found in the right colon.      Two sessile polyps were found in the cecum. The polyps were 2 to 3 mm in       size. These polyps were removed with a jumbo cold forceps. Resection and       retrieval were complete.      Non-bleeding internal hemorrhoids were found during retroflexion. The       hemorrhoids were Grade I (internal hemorrhoids that do not prolapse).      The exam was otherwise without abnormality. Impression:           - Diverticulosis in the right colon.                       - Two 2 to 3 mm polyps in the cecum, removed with a                        jumbo cold forceps. Resected and retrieved.                       - Non-bleeding internal hemorrhoids.                       - The examination was otherwise normal. Recommendation:       - Patient has a contact number available for                        emergencies. The signs and symptoms of potential                        delayed complications were discussed with the patient.                        Return to normal activities tomorrow. Written discharge                        instructions  were provided to the patient.                       - Await pathology results from EGD, also performed                        today.                       - Resume previous diet.                       - Continue present medications.                       - Repeat colonoscopy in 5 years for surveillance.                       - Return to GI office in 1 year. Procedure  Code(s):    --- Professional ---                       [phone], Colonoscopy, flexible; with biopsy, single or                        multiple Diagnosis Code(s):    --- Professional ---                       Z86.010, Personal history of colonic polyps                       K64.0, First degree hemorrhoids                       D12.0, Benign neoplasm of cecum                       K57.30, Diverticulosis of large intestine without                        perforation or abscess without bleeding CPT copyright 2018 American Medical Association. All rights reserved. The codes documented in this report are preliminary and upon coder review may  be revised to meet current compliance requirements. Efrain Sella MD, MD 09/02/2018 9:18:51 AM This report has been signed electronically. Number of Addenda: 0 Note Initiated On: 09/02/2018 8:38 AM Scope Withdrawal Time: 0 hours 8 minutes 33 seconds  Total Procedure Duration: 0 hours 12 minutes 52 seconds       [facility]

## 2018-09-02 NOTE — Op Note (Signed)
[facility] Gastroenterology Patient Name: Joy Hernandez Procedure Date: 09/02/2018 8:39 AM MRN: [id number] Account #: [id number] Date of Birth: 30-Jan-1951 Admit Type: Outpatient Age: 68 Room: [facility] ENDO ROOM 2 Gender: Female Note Status: Finalized Procedure:            Upper GI endoscopy Indications:          Surveillance for malignancy due to personal history of                        Barrett's esophagus Providers:            Benay Pike. [place] MD, MD Medicines:            Propofol per Anesthesia Complications:        No immediate complications. Procedure:            Pre-Anesthesia Assessment:                       - The risks and benefits of the procedure and the                        sedation options and risks were discussed with the                        patient. All questions were answered and informed                        consent was obtained.                       - Patient identification and proposed procedure were                        verified prior to the procedure by the nurse. The                        procedure was verified in the procedure room.                       - ASA Grade Assessment: III - A patient with severe                        systemic disease.                       - After reviewing the risks and benefits, the patient                        was deemed in satisfactory condition to undergo the                        procedure.                       After obtaining informed consent, the endoscope was                        passed under direct vision. Throughout the procedure,                        the patient's blood pressure, pulse, and oxygen  saturations were monitored continuously. The Endoscope                        was introduced through the mouth, and advanced to the                        third part of duodenum. The upper GI endoscopy was                        accomplished without difficulty. The  patient tolerated                        the procedure well. Findings:      The esophagus and gastroesophageal junction were examined with white       light. There were esophageal mucosal changes suspicious for Barrett's       esophagus. These changes involved the mucosa along an irregular Z-line       (35 cm from the incisors). No visible abnormalities were present. The       maximum longitudinal extent of these esophageal mucosal changes was 0.5       cm in length. Mucosa was biopsied with a cold forceps for histology in 4       quadrants at intervals of 0.5 cm at the gastroesophageal junction. One       specimen bottle was sent to pathology.      Patchy moderate inflammation characterized by congestion (edema) and       erythema was found in the entire examined stomach. Biopsies were taken       with a cold forceps for histology. Biopsies were obtained in the cardia       to rule out gastric intestinal metaplasia to compare with esophageal       biopsies.      A 1 cm hiatal hernia was present.      Localized mild inflammation characterized by erythema and friability was       found in the duodenal bulb.      The exam was otherwise without abnormality. Impression:           - Esophageal mucosal changes suspicious for Barrett's                        esophagus. Biopsied.                       - Gastritis. Biopsied.                       - 1 cm hiatal hernia.                       - Duodenitis.                       - The examination was otherwise normal. Recommendation:       - Await pathology results.                       - Repeat upper endoscopy for surveillance based on                        pathology results.                       -  Proceed with colonoscopy Procedure Code(s):    --- Professional ---                       [phone], Esophagogastroduodenoscopy, flexible, transoral;                        with biopsy, single or multiple Diagnosis Code(s):    --- Professional ---                        K29.80, Duodenitis without bleeding                       K44.9, Diaphragmatic hernia without obstruction or                        gangrene                       K29.70, Gastritis, unspecified, without bleeding                       K22.70, Barrett's esophagus without dysplasia CPT copyright 2018 American Medical Association. All rights reserved. The codes documented in this report are preliminary and upon coder review may  be revised to meet current compliance requirements. Efrain Sella MD, MD 09/02/2018 9:00:44 AM This report has been signed electronically. Number of Addenda: 0 Note Initiated On: 09/02/2018 8:39 AM      [facility]

## 2018-09-02 NOTE — H&P (Signed)
Outpatient short stay form Pre-procedure 09/02/2018 8:32 AM Teodoro K. Alice Reichert, M.D.  Primary Physician: Derinda Late, M.D.  Reason for visit: Barrett's Esophagus, personal hx of colon polyps.  History of present illness:  Patient presents for Barrett's esophagus surveillance, personal hx of colon polyps. The patient understands the nature of the planned procedure, indications, risks, alternatives and potential complications including but not limited to bleeding, infection, perforation, damage to internal organs and possible oversedation/side effects from anesthesia. The patient agrees and gives consent to proceed.  Please refer to procedure notes for findings, recommendations and patient disposition/instructions.    Current Facility-Administered Medications:  .  0.9 %  sodium chloride infusion, , Intravenous, Continuous, [place], Benay Pike, MD  Medications Prior to Admission  Medication Sig Dispense Refill Last Dose  . Ascorbic Acid (VITAMIN C) 1000 MG tablet Take 1,000 mg by mouth daily.   Past Week at Unknown time  . bisoprolol-hydrochlorothiazide (ZIAC) 2.5-6.25 MG per tablet Take 1 tablet by mouth daily.   09/02/2018 at 0500  . Fish Oil-Cholecalciferol (FISH OIL + D3 PO) Take by mouth.   Past Week at Unknown time  . fluocinonide ointment (LIDEX) 5.39 % Apply 1 application topically 2 (two) times daily.   Past Week at Unknown time  . fluticasone (FLONASE) 50 MCG/ACT nasal spray Place 2 sprays into both nostrils daily.    at prn  . Multiple Vitamin (MULTIVITAMIN) tablet Take 1 tablet by mouth daily.   Past Week at Unknown time  . pantoprazole (PROTONIX) 40 MG tablet Take 40 mg by mouth daily.   09/01/2018 at Unknown time  . simvastatin (ZOCOR) 40 MG tablet Take 40 mg by mouth daily.   09/01/2018 at Unknown time  . Turmeric 500 MG CAPS Take by mouth.   09/01/2018 at Unknown time  . vitamin B-12 (CYANOCOBALAMIN) 1000 MCG tablet Take 1,000 mcg by mouth daily.   09/01/2018 at Unknown time  .  Vitamin D, Ergocalciferol, (DRISDOL) 50000 UNITS CAPS capsule Take 50,000 Units by mouth every 7 (seven) days.   09/01/2018 at Unknown time  . aspirin 81 MG tablet Take 81 mg by mouth daily.   Not Taking at Unknown time  . escitalopram (LEXAPRO) 10 MG tablet Take 10 mg by mouth daily.   Not Taking at Unknown time     Allergies  Allergen Reactions  . Morphine And Related   . Sulfa Antibiotics      Past Medical History:  Diagnosis Date  . Barrett's esophagus   . Cancer ([place])    appendix  . GERD (gastroesophageal reflux disease)   . Hyperlipidemia   . Hypertension     Review of systems:  Otherwise negative.    Physical Exam  Gen: Alert, oriented. Appears stated age.  HEENT: [place]/AT. PERRLA. Lungs: CTA, no wheezes. CV: RR nl S1, S2. Abd: soft, benign, no masses. BS+ Ext: No edema. Pulses 2+    Planned procedures: Proceed with EGD and colonoscopy. The patient understands the nature of the planned procedure, indications, risks, alternatives and potential complications including but not limited to bleeding, infection, perforation, damage to internal organs and possible oversedation/side effects from anesthesia. The patient agrees and gives consent to proceed.  Please refer to procedure notes for findings, recommendations and patient disposition/instructions.     Teodoro K. Alice Reichert, M.D. Gastroenterology 09/02/2018  8:32 AM

## 2018-09-02 NOTE — Anesthesia Post-op Follow-up Note (Signed)
Anesthesia QCDR form completed.        

## 2018-09-02 NOTE — Transfer of Care (Signed)
Immediate Anesthesia Transfer of Care Note  Patient: Joy Hernandez  Procedure(s) Performed: ESOPHAGOGASTRODUODENOSCOPY (EGD) WITH PROPOFOL (N/A ) COLONOSCOPY WITH PROPOFOL (N/A )  Patient Location: PACU  Anesthesia Type:General  Level of Consciousness: sedated  Airway & Oxygen Therapy: Patient Spontanous Breathing and Patient connected to nasal cannula oxygen  Post-op Assessment: Report given to RN and Post -op Vital signs reviewed and stable  Post vital signs: Reviewed and stable  Last Vitals:  Vitals Value Taken Time  BP    Temp 36.1 C 09/02/2018  9:19 AM  Pulse 70 09/02/2018  9:19 AM  Resp 17 09/02/2018  9:19 AM  SpO2 93 % 09/02/2018  9:19 AM    Last Pain:  Vitals:   09/02/18 0919  TempSrc: Temporal  PainSc: 0-No pain         Complications: No apparent anesthesia complications

## 2018-09-03 ENCOUNTER — Encounter: Payer: Self-pay | Admitting: Internal Medicine

## 2018-09-03 LAB — SURGICAL PATHOLOGY

## 2019-04-22 ENCOUNTER — Other Ambulatory Visit: Payer: Self-pay | Admitting: Family Medicine

## 2019-04-22 DIAGNOSIS — Z1231 Encounter for screening mammogram for malignant neoplasm of breast: Secondary | ICD-10-CM

## 2019-05-27 ENCOUNTER — Ambulatory Visit
Admission: RE | Admit: 2019-05-27 | Discharge: 2019-05-27 | Disposition: A | Discharge status: Home / Self Care | Payer: Medicare HMO | Source: Ambulatory Visit | Attending: Family Medicine | Admitting: Family Medicine

## 2019-05-27 ENCOUNTER — Other Ambulatory Visit: Payer: Self-pay

## 2019-05-27 DIAGNOSIS — Z1231 Encounter for screening mammogram for malignant neoplasm of breast: Secondary | ICD-10-CM | POA: Diagnosis present

## 2020-04-14 ENCOUNTER — Other Ambulatory Visit: Payer: Self-pay | Admitting: Family Medicine

## 2020-04-14 DIAGNOSIS — Z1231 Encounter for screening mammogram for malignant neoplasm of breast: Secondary | ICD-10-CM

## 2020-06-01 ENCOUNTER — Other Ambulatory Visit: Payer: Self-pay

## 2020-06-01 ENCOUNTER — Ambulatory Visit
Admission: RE | Admit: 2020-06-01 | Discharge: 2020-06-01 | Disposition: A | Discharge status: Home / Self Care | Payer: Medicare HMO | Source: Ambulatory Visit | Attending: Family Medicine | Admitting: Family Medicine

## 2020-06-01 DIAGNOSIS — Z1231 Encounter for screening mammogram for malignant neoplasm of breast: Secondary | ICD-10-CM | POA: Diagnosis not present

## 2021-02-17 ENCOUNTER — Ambulatory Visit: Payer: Self-pay | Attending: Internal Medicine

## 2021-02-17 ENCOUNTER — Other Ambulatory Visit: Payer: Self-pay

## 2021-02-17 DIAGNOSIS — Z23 Encounter for immunization: Secondary | ICD-10-CM

## 2021-02-17 MED ORDER — COVID-19 MRNA VAC-TRIS(PFIZER) 30 MCG/0.3ML IM SUSP
INTRAMUSCULAR | 0 refills | Status: DC
  Filled 2021-02-17: qty 0.3, 1d supply, fill #0

## 2021-02-17 NOTE — Progress Notes (Signed)
   Covid-19 Vaccination Clinic  Name:  Joy Hernandez    MRN: [id number] DOB: 12/05/1950  02/17/2021  [name] was observed post Covid-19 immunization for 15 minutes without incident. She was provided with Vaccine Information Sheet and instruction to access the V-Safe system.   [name] was instructed to call 911 with any severe reactions post vaccine: Difficulty breathing  Swelling of face and throat  A fast heartbeat  A bad rash all over body  Dizziness and weakness   Immunizations Administered     Name Date Dose VIS Date Route   PFIZER Comrnaty(Gray TOP) Covid-19 Vaccine 02/17/2021 10:53 AM 0.3 mL 07/14/2020 Intramuscular   Manufacturer: [place]   Lot: [id number]   [place]: [phone]

## 2021-04-14 ENCOUNTER — Other Ambulatory Visit: Payer: Self-pay

## 2021-04-18 ENCOUNTER — Other Ambulatory Visit: Payer: Self-pay | Admitting: Family Medicine

## 2021-04-18 DIAGNOSIS — Z1231 Encounter for screening mammogram for malignant neoplasm of breast: Secondary | ICD-10-CM

## 2021-06-07 ENCOUNTER — Other Ambulatory Visit: Payer: Self-pay

## 2021-06-07 ENCOUNTER — Ambulatory Visit
Admission: RE | Admit: 2021-06-07 | Discharge: 2021-06-07 | Disposition: A | Discharge status: Home / Self Care | Payer: Medicare HMO | Source: Ambulatory Visit | Attending: Family Medicine | Admitting: Family Medicine

## 2021-06-07 DIAGNOSIS — Z1231 Encounter for screening mammogram for malignant neoplasm of breast: Secondary | ICD-10-CM | POA: Insufficient documentation

## 2021-06-20 ENCOUNTER — Other Ambulatory Visit: Payer: Self-pay

## 2021-06-20 ENCOUNTER — Ambulatory Visit: Payer: Medicare HMO | Attending: Internal Medicine

## 2021-06-20 DIAGNOSIS — Z23 Encounter for immunization: Secondary | ICD-10-CM

## 2021-06-20 MED ORDER — PFIZER COVID-19 VAC BIVALENT 30 MCG/0.3ML IM SUSP
INTRAMUSCULAR | 0 refills | Status: DC
  Filled 2021-06-20: qty 0.3, 1d supply, fill #0

## 2021-06-20 NOTE — Progress Notes (Signed)
   Covid-19 Vaccination Clinic  Name:  Joy Hernandez    MRN: [id number] DOB: [birth year]  06/20/2021  [name] was observed post Covid-19 immunization for 15 minutes without incident. She was provided with Vaccine Information Sheet and instruction to access the V-Safe system.   [name] was instructed to call 911 with any severe reactions post vaccine: Difficulty breathing  Swelling of face and throat  A fast heartbeat  A bad rash all over body  Dizziness and weakness   Immunizations Administered     Name Date Dose VIS Date Route   Pfizer Covid-19 Vaccine Bivalent Booster 06/20/2021 10:52 AM 0.3 mL 04/05/2021 Intramuscular   Manufacturer: [place]   Lot: [id number]   [place]: [place], PharmD, MBA Clinical Acute Care Pharmacist

## 2021-08-15 IMAGING — MG DIGITAL SCREENING BILAT W/ TOMO W/ CAD
8 series · 8 of 24 positions shown · non-contrast
Comparison: Previous exam(s).

CLINICAL DATA: Screening.

EXAM:
DIGITAL SCREENING BILATERAL MAMMOGRAM WITH TOMO AND CAD

[R MLO synth-2D]
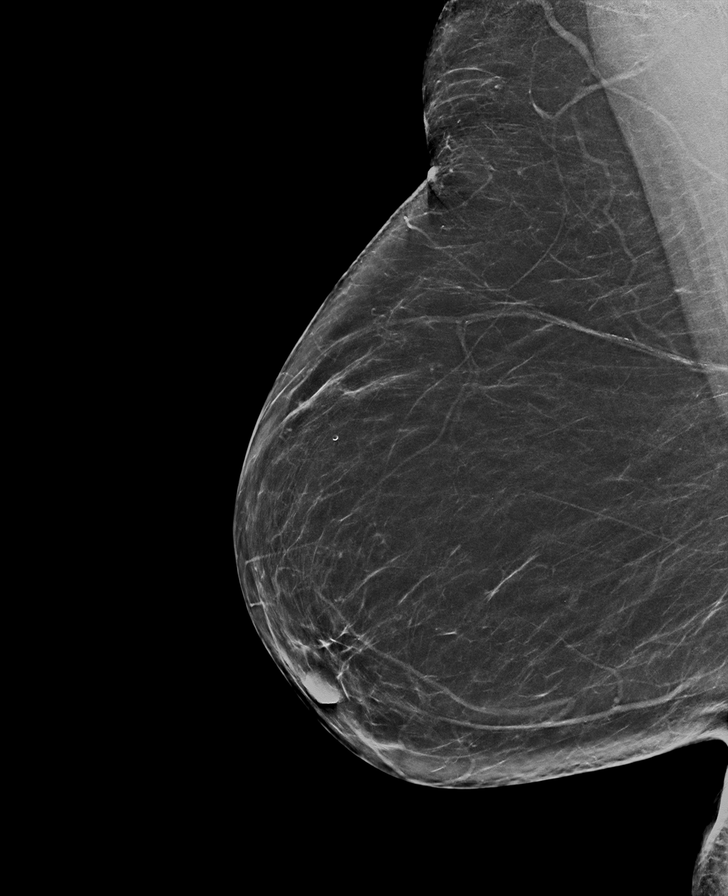

[L MLO synth-2D]
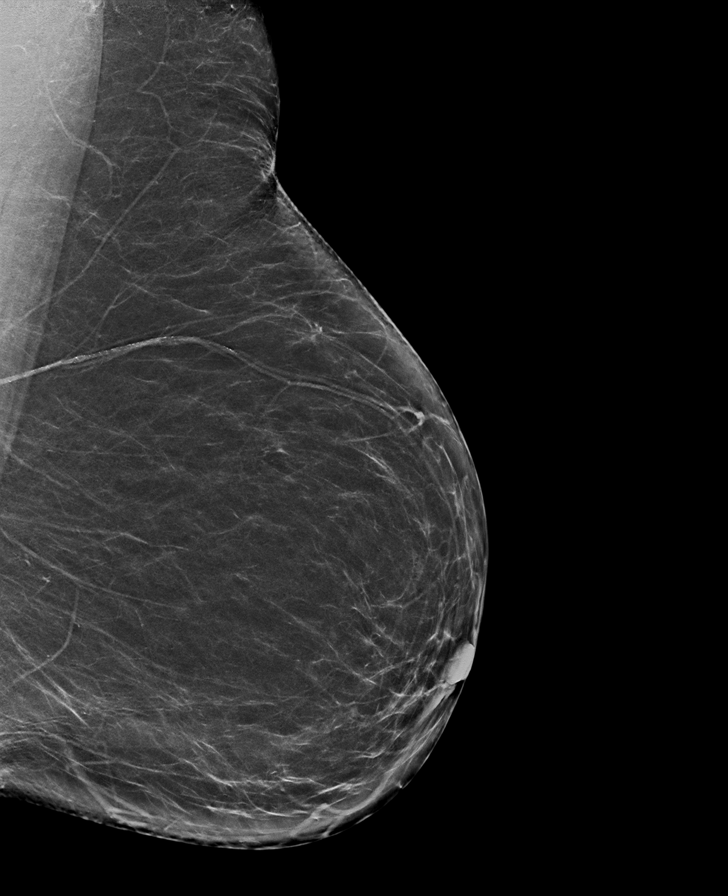

[L CC synth-2D]
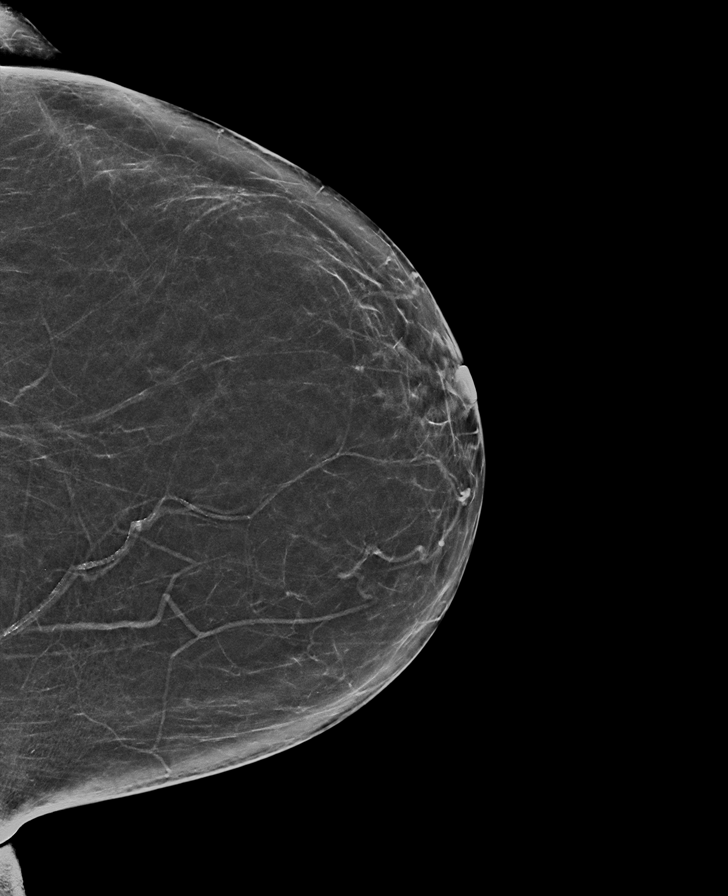

[R CC synth-2D]
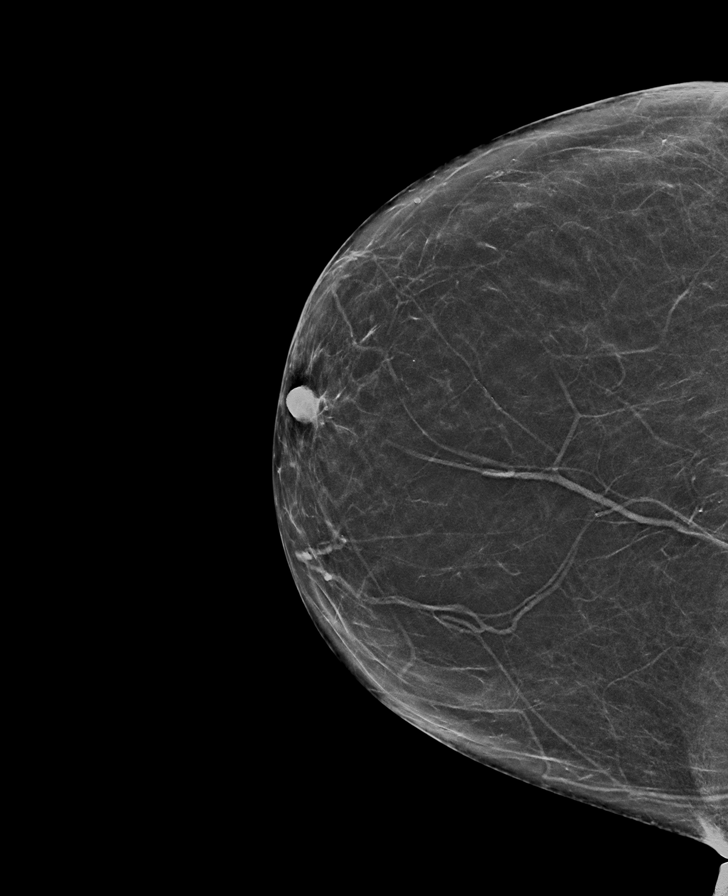

[R MLO tomo · tomo slice 37/72.0]
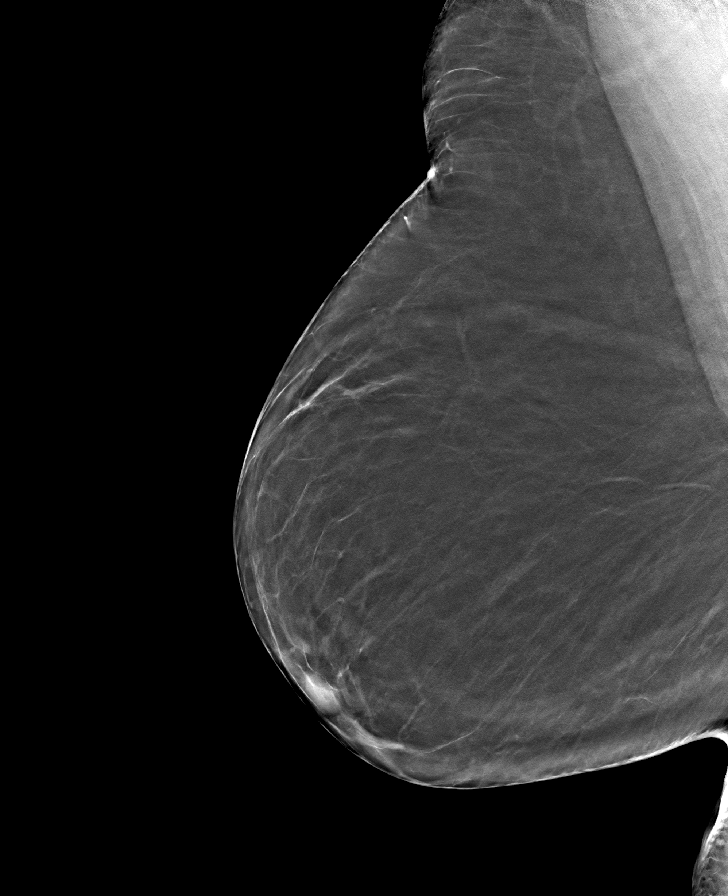

[R CC tomo · tomo slice 31/61.0]
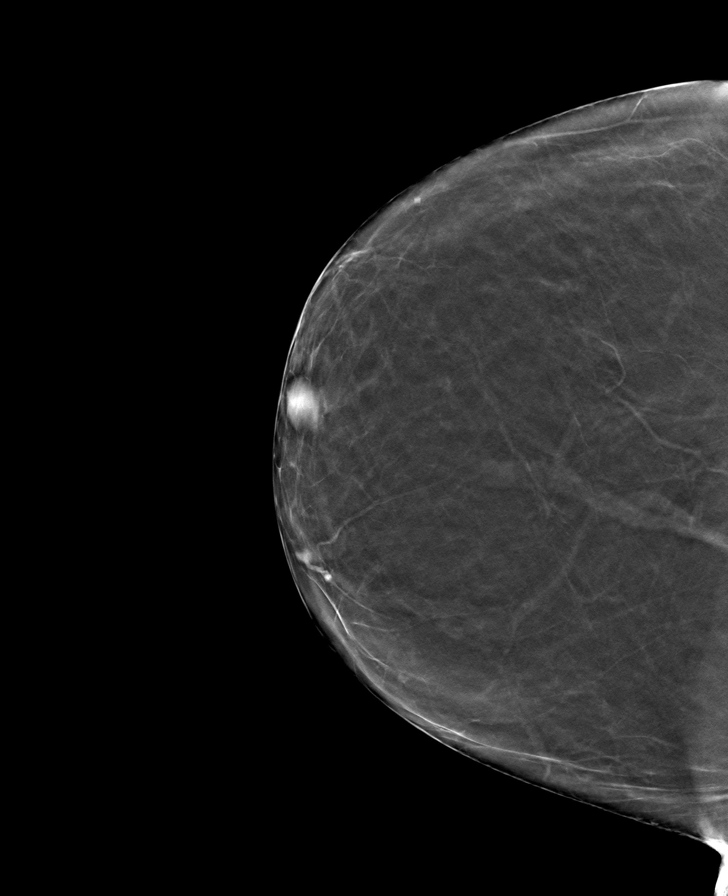

[L MLO tomo · tomo slice 36/71.0]
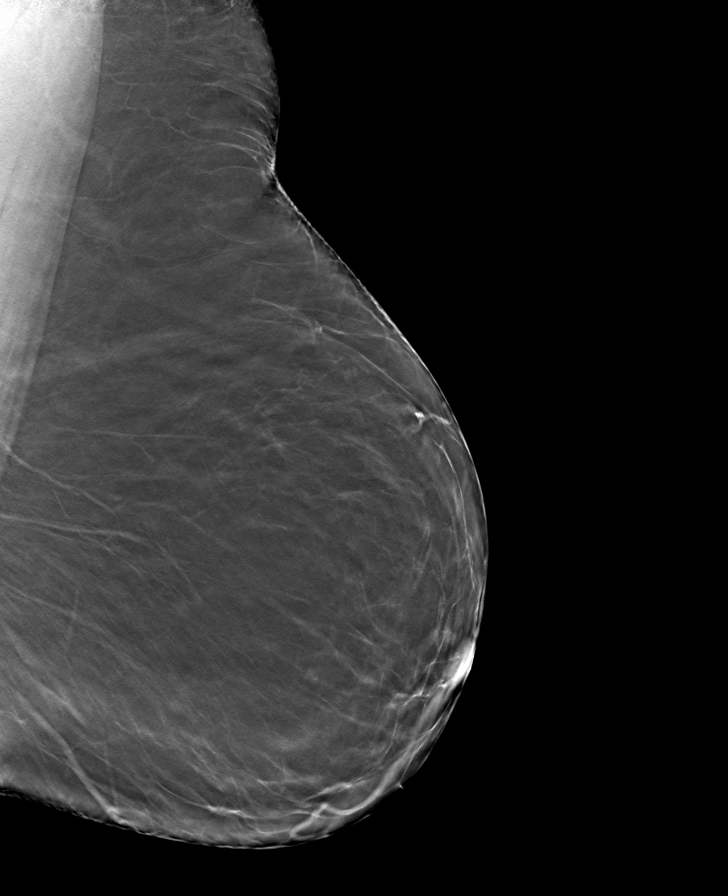

[L CC tomo · tomo slice 34/67.0]
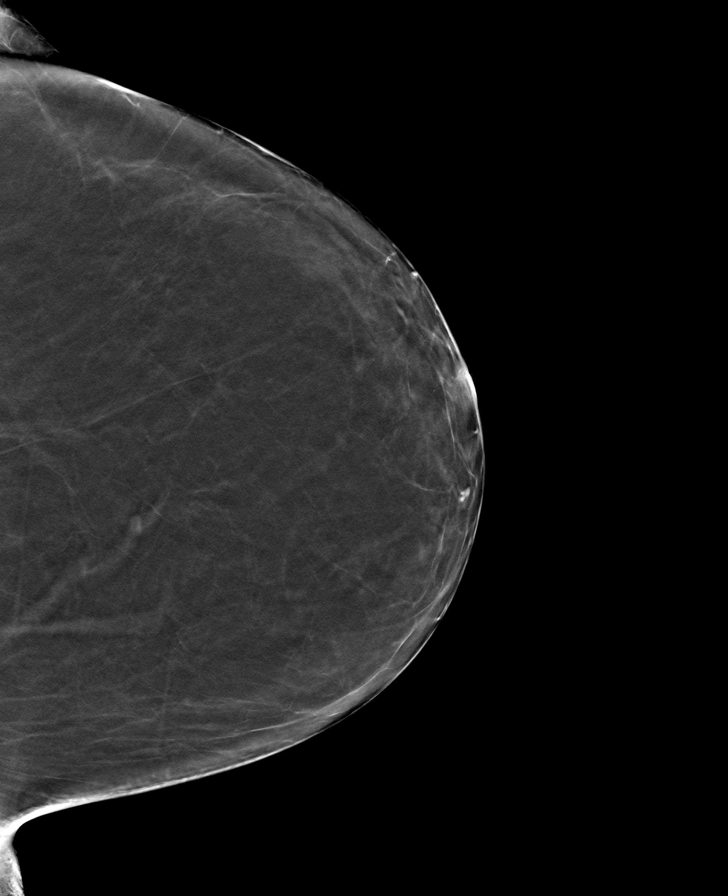

[8 of 24 positions shown; findings below may reference images not displayed]

ACR Breast Density Category b: There are scattered areas of
fibroglandular density.
FINDINGS: There are no findings suspicious for malignancy. Images were
processed with CAD.
IMPRESSION: No mammographic evidence of malignancy. A result letter of this
screening mammogram will be mailed directly to the patient.

RECOMMENDATION:
Screening mammogram in one year. (Code:[id number])

BI-RADS CATEGORY  1: Negative.

## 2022-03-07 ENCOUNTER — Other Ambulatory Visit: Payer: Self-pay | Admitting: Physical Medicine & Rehabilitation

## 2022-03-07 DIAGNOSIS — G8929 Other chronic pain: Secondary | ICD-10-CM

## 2022-03-13 ENCOUNTER — Other Ambulatory Visit: Payer: Medicare HMO

## 2022-03-22 ENCOUNTER — Other Ambulatory Visit: Payer: Medicare HMO

## 2022-04-24 ENCOUNTER — Other Ambulatory Visit: Payer: Self-pay | Admitting: Family Medicine

## 2022-04-24 DIAGNOSIS — Z1231 Encounter for screening mammogram for malignant neoplasm of breast: Secondary | ICD-10-CM

## 2022-05-17 ENCOUNTER — Other Ambulatory Visit: Payer: Self-pay

## 2022-05-17 MED ORDER — COMIRNATY 30 MCG/0.3ML IM SUSP
INTRAMUSCULAR | 0 refills | Status: DC
  Filled 2022-05-18: qty 0.3, 1d supply, fill #0

## 2022-05-18 ENCOUNTER — Other Ambulatory Visit: Payer: Self-pay

## 2022-06-11 ENCOUNTER — Ambulatory Visit: Payer: Medicare HMO

## 2022-06-12 ENCOUNTER — Ambulatory Visit: Payer: Medicare HMO

## 2022-06-20 ENCOUNTER — Ambulatory Visit: Payer: Medicare HMO

## 2022-07-23 ENCOUNTER — Ambulatory Visit
Admission: RE | Admit: 2022-07-23 | Discharge: 2022-07-23 | Disposition: A | Discharge status: Home / Self Care | Payer: Medicare Other | Source: Ambulatory Visit | Attending: Family Medicine | Admitting: Family Medicine

## 2022-07-23 DIAGNOSIS — Z1231 Encounter for screening mammogram for malignant neoplasm of breast: Secondary | ICD-10-CM | POA: Insufficient documentation

## 2022-08-23 ENCOUNTER — Ambulatory Visit
Admission: RE | Admit: 2022-08-23 | Discharge: 2022-08-23 | Disposition: A | Discharge status: Home / Self Care | Payer: Medicare Other | Source: Ambulatory Visit | Attending: Physical Medicine & Rehabilitation | Admitting: Physical Medicine & Rehabilitation

## 2022-08-23 DIAGNOSIS — G8929 Other chronic pain: Secondary | ICD-10-CM

## 2022-08-27 ENCOUNTER — Other Ambulatory Visit: Payer: Medicare Other

## 2022-08-28 ENCOUNTER — Encounter: Payer: Self-pay | Admitting: Physical Medicine & Rehabilitation

## 2022-08-28 ENCOUNTER — Other Ambulatory Visit: Payer: Self-pay | Admitting: Physical Medicine & Rehabilitation

## 2022-08-28 DIAGNOSIS — N838 Other noninflammatory disorders of ovary, fallopian tube and broad ligament: Secondary | ICD-10-CM

## 2022-08-30 ENCOUNTER — Ambulatory Visit
Admission: RE | Admit: 2022-08-30 | Discharge: 2022-08-30 | Disposition: A | Discharge status: Home / Self Care | Payer: Medicare Other | Source: Ambulatory Visit | Attending: Physical Medicine & Rehabilitation | Admitting: Physical Medicine & Rehabilitation

## 2022-08-30 DIAGNOSIS — N838 Other noninflammatory disorders of ovary, fallopian tube and broad ligament: Secondary | ICD-10-CM

## 2022-09-24 NOTE — Progress Notes (Unsigned)
Referring Physician:  Derinda Late, MD [phone] [place],  [place] [address]  Primary Physician:  Derinda Late, MD  History of Present Illness: 09/25/2022 Joy Hernandez is here today with a chief complaint of low back and right lower extremity pain.  Her pain is in her buttock and in her posterior thigh.  She also has discomfort in her anterior calf.  She has trouble with walking and has pain almost immediately upon standing.  She is bent forward.  Sitting and laying down make it better.  Bowel/Bladder Dysfunction: none  Conservative measures:  Physical therapy:  has participated in at [facility] 08/14/22-08/23/22 without relief  Multimodal medical therapy including regular antiinflammatories:  tylenol, advil, gabapentin Injections:  has received epidural steroid injections 09/07/22: Bilateral L4-5 TF ESI by Dr. Alba Destine (helped for 1 day)  Past Surgery: denies  Joy Hernandez has no symptoms of cervical myelopathy.  The symptoms are causing a significant impact on the patient's life.   I have utilized the care everywhere function in epic to review the outside records available from external health systems.  Review of Systems:  A 10 point review of systems is negative, except for the pertinent positives and negatives detailed in the HPI.  Past Medical History: Past Medical History:  Diagnosis Date   Adenocarcinoma in situ    appendix   Arthritis    Barrett's esophagus    Cancer ([place])    appendix   GERD (gastroesophageal reflux disease)    History of colonic polyps    Hyperlipidemia    Hypertension    IBS (irritable bowel syndrome)     Past Surgical History: Past Surgical History:  Procedure Laterality Date   APPENDECTOMY     BREAST BIOPSY Left 1990   needle bx- neg   CHOLECYSTECTOMY     COLONOSCOPY WITH PROPOFOL N/A 09/02/2018   Procedure: COLONOSCOPY WITH PROPOFOL;  Surgeon: Toledo, Benay Pike, MD;   Location: [facility] ENDOSCOPY;  Service: Gastroenterology;  Laterality: N/A;   ESOPHAGOGASTRODUODENOSCOPY N/A 03/31/2015   Procedure: ESOPHAGOGASTRODUODENOSCOPY (EGD);  Surgeon: Hulen Luster, MD;  Location: [facility] ENDOSCOPY;  Service: Gastroenterology;  Laterality: N/A;   ESOPHAGOGASTRODUODENOSCOPY (EGD) WITH PROPOFOL N/A 09/02/2018   Procedure: ESOPHAGOGASTRODUODENOSCOPY (EGD) WITH PROPOFOL;  Surgeon: Toledo, Benay Pike, MD;  Location: [facility] ENDOSCOPY;  Service: Gastroenterology;  Laterality: N/A;   Excision compound atypical nevus left shoulder  08/19/2007   Left salpingectomy     TUBAL LIGATION      Allergies: Allergies as of 09/25/2022 - Review Complete 09/25/2022  Allergen Reaction Noted   Morphine and related  09/01/2018   Morphine sulfate Rash 07/07/2018   Sulfa antibiotics Other (See Comments) and Rash 03/11/2014    Medications: Current Meds  Medication Sig   Alpha-Lipoic Acid 600 MG CAPS Take 600 mg by mouth daily.   Ascorbic Acid (VITAMIN C) 1000 MG tablet Take 1,000 mg by mouth daily.   bisoprolol-hydrochlorothiazide (ZIAC) 2.5-6.25 MG per tablet Take 1 tablet by mouth daily.   cetirizine (ZYRTEC) 10 MG chewable tablet Chew 10 mg by mouth daily.   Cholecalciferol 50 MCG (2000 UT) CAPS Take 2,000 Units by mouth daily.   Cinnamon 500 MG capsule Take 500 mg by mouth daily.   Cranberry 500 MG CAPS Take by mouth daily.   gabapentin (NEURONTIN) 300 MG capsule Take 1 capsule by mouth 3 (three) times daily.   Glucosamine-Chondroit-Vit C-Mn (GLUCOSAMINE 1500 COMPLEX PO) Take by mouth daily.  levothyroxine (SYNTHROID) 50 MCG tablet Take 50 mcg by mouth daily.   MAGNESIUM GLYCINATE PO Take 500 mg by mouth daily.   Multiple Vitamin (MULTIVITAMIN) tablet Take 1 tablet by mouth daily.   omega-3 fish oil (MAXEPA) 1000 MG CAPS capsule Take 1 capsule by mouth daily.   pantoprazole (PROTONIX) 40 MG tablet Take 40 mg by mouth daily.   Probiotic Product (PROBIOTIC PO) Take by mouth daily.    simvastatin (ZOCOR) 40 MG tablet Take 40 mg by mouth daily.   Turmeric 500 MG CAPS Take by mouth daily.   vitamin B-12 (CYANOCOBALAMIN) 1000 MCG tablet Take 1,000 mcg by mouth daily.    Social History: Social History   Tobacco Use   Smoking status: Former    Types: Cigarettes    Quit date: 01/04/2017    Years since quitting: 5.7   Smokeless tobacco: Never  Vaping Use   Vaping Use: Never used  Substance Use Topics   Alcohol use: Not Currently   Drug use: Never    Family Medical History: Family History  Problem Relation Age of Onset   Breast cancer Sister 17    Physical Examination: Vitals:   09/25/22 1017  BP: (!) 178/85  Pulse: 64    General: Patient is well developed, well nourished, calm, collected, and in no apparent distress. Attention to examination is appropriate.  Neck:   Supple.  Full range of motion.  Respiratory: Patient is breathing without any difficulty.   NEUROLOGICAL:     Awake, alert, oriented to person, place, and time.  Speech is clear and fluent.   Cranial Nerves: Pupils equal round and reactive to light.  Facial tone is symmetric.  Facial sensation is symmetric. Shoulder shrug is symmetric. Tongue protrusion is midline.  There is no pronator drift.  ROM of spine: full.    Strength: Side Biceps Triceps Deltoid Interossei Grip Wrist Ext. Wrist Flex.  R 5 5 5 5 5 5 5  $ L 5 5 5 5 5 5 5   $ Side Iliopsoas Quads Hamstring PF DF EHL  R 5 5 5 5 5 5  $ L 5 5 5 5 5 5   $ Reflexes are 1+ and symmetric at the biceps, triceps, brachioradialis, patella and achilles.   Hoffman's is absent.   Bilateral upper and lower extremity sensation is intact to light touch.    No evidence of dysmetria noted.  Gait is stooped.     Medical Decision Making  Imaging: MRI L spine 08/23/2022 Disc levels:   T12-L1: No significant disc protrusion, foraminal stenosis, or canal stenosis.   L1-L2: Minimal disc bulge. Mild bilateral facet hypertrophy. No significant  foraminal stenosis. No canal stenosis.   L2-L3: Minimal disc bulge and mild bilateral facet arthropathy. No significant foraminal stenosis. No canal stenosis.   L3-L4: Disc bulge with moderate bilateral facet arthropathy and ligamentum flavum buckling. There is severe canal stenosis with mild-to-moderate bilateral foraminal stenosis.   L4-L5: Mild annular disc bulge and mild bilateral facet arthropathy. Mild left foraminal stenosis. No canal stenosis.   L5-S1: Disc height loss with endplate ridging. Mild bilateral facet arthropathy. Mild left foraminal stenosis. No canal stenosis.   IMPRESSION: 1. Multilevel lumbar spondylosis, most pronounced at the L3-L4 level where there is severe canal stenosis and mild-to-moderate bilateral foraminal stenosis. 2. Chronic superior endplate compression deformity of L1. No acute osseous findings. 3. Partially imaged cystic structure within the right pelvis measuring at least 4 cm in size. Pelvic ultrasound is recommended for further evaluation.  Electronically Signed   By: Davina Poke D.O.   On: 08/24/2022 10:07  I have personally reviewed the images and agree with the above interpretation.  Assessment and Plan: Joy Hernandez is a pleasant 72 y.o. 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  She also had an area at the L4-5 level where there appears to be some abnormal anatomic location of her nerve roots.  It does not appear neoplastic, but I am concerned she may have some other space occupying lesion such as a schwannoma in that area.  I would like to review her case with the radiologist who reviewed her MRI scan.  We may obtain an MRI scan with contrast.  She would like to try additional physical therapy.  I will send a referral for her and see her back in 6 to 8 weeks.  If she has worsening symptoms in the meantime, I will be happy to see her sooner.    Thank you for involving me in the care of this patient.       Sherald Balbuena K. Izora Ribas MD, [facility] Neurosurgery

## 2022-09-25 ENCOUNTER — Ambulatory Visit: Payer: Medicare Other | Admitting: Neurosurgery

## 2022-09-25 ENCOUNTER — Encounter: Payer: Self-pay | Admitting: Neurosurgery

## 2022-09-25 VITALS — BP 178/85 | HR 64 | Ht 63.5 in | Wt 188.0 lb

## 2022-09-25 DIAGNOSIS — M48062 Spinal stenosis, lumbar region with neurogenic claudication: Secondary | ICD-10-CM | POA: Diagnosis not present

## 2022-09-26 ENCOUNTER — Telehealth: Payer: Self-pay

## 2022-09-26 NOTE — Telephone Encounter (Signed)
I spoke with Mrs Glickstein about Dr Rhea Bleacher recommendation.

## 2022-09-26 NOTE — Telephone Encounter (Signed)
-----   Message from Meade Maw, MD sent at 09/25/2022  5:15 PM EST ----- Having another look in for getting back to me so quickly.  Ophelia Shoulder, can you let her know that additional review from the radiology team felt that my concerns were explainable by the level of compression she has on her MRI scan.  She does not need additional imaging.  I will see her back after physical therapy.   ----- Message ----- From: Karie Mainland, DO Sent: 09/25/2022  11:30 AM EST To: Meade Maw, MD  Good morning. I took another look at the MRI on this patient and also reviewed it with two other rads who also read a lot of spine. We all think that more than likely this represents an enlarged, irritated nerve root which appears bunched secondary to the stenosis at the adjacent L3-L4 disc space. I do not think this reflects a nerve sheath tumor.   A follow up MRI may be helpful to see if it regains a more normal morphology after surgery if this patient is going to be decompressed.  Hope this is helpful.  Merrilee Seashore   ----- Message ----- From: Meade Maw, MD Sent: 09/25/2022  10:53 AM EST To: Lincoln Brigham Plundo, DO  Good morning-  I just saw this patient in consultation.  She has severe stenosis at L3-4, but was wondering if you could have a look at her MRI scan at L4-5.  On the sagittal T2, it looks like she has a strange pattern of curvature in her nerve roots.  On her axial T2, there is what appears to be an enlarged tubular structure at the L4-5 level.  It does not the filum I do not think.  I was wondering whether you thought she might have schwannoma or something like that.  Would this warrant obtaining T1 postcontrast imaging?  Thanks, [employer]

## 2022-11-27 ENCOUNTER — Encounter: Payer: Self-pay | Admitting: Neurosurgery

## 2022-11-27 ENCOUNTER — Other Ambulatory Visit: Payer: Self-pay

## 2022-11-27 ENCOUNTER — Ambulatory Visit: Payer: Medicare Other | Admitting: Neurosurgery

## 2022-11-27 VITALS — BP 186/88 | HR 61 | Ht 63.5 in | Wt 192.2 lb

## 2022-11-27 DIAGNOSIS — M48062 Spinal stenosis, lumbar region with neurogenic claudication: Secondary | ICD-10-CM

## 2022-11-27 DIAGNOSIS — Z01818 Encounter for other preprocedural examination: Secondary | ICD-10-CM

## 2022-11-27 NOTE — H&P (View-Only) (Signed)
  Referring Physician:  Babaoff, Marcus, MD [address] [facility] [place] [facility] [place],  [place] [address]  Primary Physician:  Babaoff, Marcus, MD  History of Present Illness: 11/27/2022  Mrs. Lucks returns to see me.  She has been doing physical therapy since I last saw her.  Her symptoms have worsened and are now impacting her left leg as well.  She is very frustrated with her lack of improvement.  09/25/2022 Ms. Herma Fidel is here today with a chief complaint of low back and right lower extremity pain.  Her pain is in her buttock and in her posterior thigh.  She also has discomfort in her anterior calf.  She has trouble with walking and has pain almost immediately upon standing.  She is bent forward.  Sitting and laying down make it better.  Bowel/Bladder Dysfunction: none  Conservative measures:  Physical therapy:  has participated in at [facility] 08/14/22-08/23/22 without relief  Multimodal medical therapy including regular antiinflammatories:  tylenol, advil, gabapentin Injections:  has received epidural steroid injections 09/07/22: Bilateral L4-5 TF ESI by Dr. Morales (helped for 1 day)  Past Surgery: denies  Pelagia S Eunice has no symptoms of cervical myelopathy.  The symptoms are causing a significant impact on the patient's life.   I have utilized the care everywhere function in epic to review the outside records available from external health systems.  Review of Systems:  A 10 point review of systems is negative, except for the pertinent positives and negatives detailed in the HPI.  Past Medical History: Past Medical History:  Diagnosis Date   Adenocarcinoma in situ    appendix   Arthritis    Barrett's esophagus    Cancer    appendix   GERD (gastroesophageal reflux disease)    History of colonic polyps    Hyperlipidemia    Hypertension    IBS (irritable bowel syndrome)     Past Surgical History: Past Surgical History:  Procedure  Laterality Date   APPENDECTOMY     BREAST BIOPSY Left 1990   needle bx- neg   CHOLECYSTECTOMY     COLONOSCOPY WITH PROPOFOL N/A 09/02/2018   Procedure: COLONOSCOPY WITH PROPOFOL;  Surgeon: Toledo, Teodoro K, MD;  Location: [facility] ENDOSCOPY;  Service: Gastroenterology;  Laterality: N/A;   ESOPHAGOGASTRODUODENOSCOPY N/A 03/31/2015   Procedure: ESOPHAGOGASTRODUODENOSCOPY (EGD);  Surgeon: Paul Y Oh, MD;  Location: [facility] ENDOSCOPY;  Service: Gastroenterology;  Laterality: N/A;   ESOPHAGOGASTRODUODENOSCOPY (EGD) WITH PROPOFOL N/A 09/02/2018   Procedure: ESOPHAGOGASTRODUODENOSCOPY (EGD) WITH PROPOFOL;  Surgeon: Toledo, Teodoro K, MD;  Location: [facility] ENDOSCOPY;  Service: Gastroenterology;  Laterality: N/A;   Excision compound atypical nevus left shoulder  08/19/2007   Left salpingectomy     TUBAL LIGATION      Allergies: Allergies as of 11/27/2022 - Review Complete 11/27/2022  Allergen Reaction Noted   Morphine and related  09/01/2018   Morphine sulfate Rash 07/07/2018   Sulfa antibiotics Other (See Comments) and Rash 03/11/2014    Medications: No outpatient medications have been marked as taking for the 11/27/22 encounter (Office Visit) with Kara Melching, MD.    Social History: Social History   Tobacco Use   Smoking status: Former    Types: Cigarettes    Quit date: 01/04/2017    Years since quitting: 5.8   Smokeless tobacco: Never  Vaping Use   Vaping Use: Never used  Substance Use Topics   Alcohol use: Not Currently   Drug use: Never      Family Medical History: Family History  Problem Relation Age of Onset   Breast cancer Sister 50    Physical Examination: Vitals:   11/27/22 1021  BP: (!) 186/88  Pulse: 61    General: Patient is well developed, well nourished, calm, collected, and in no apparent distress. Attention to examination is appropriate.  Neck:   Supple.  Full range of motion.  Respiratory: Patient is breathing without any difficulty.   NEUROLOGICAL:      Awake, alert, oriented to person, place, and time.  Speech is clear and fluent.   Cranial Nerves: Pupils equal round and reactive to light.  Facial tone is symmetric.  Facial sensation is symmetric. Shoulder shrug is symmetric. Tongue protrusion is midline.  There is no pronator drift.  ROM of spine: full.    Strength: Side Biceps Triceps Deltoid Interossei Grip Wrist Ext. Wrist Flex.  R 5 5 5 5 5 5 5  L 5 5 5 5 5 5 5   Side Iliopsoas Quads Hamstring PF DF EHL  R 5 5 5 5 5 5  L 5 5 5 5 5 5   Reflexes are 1+ and symmetric at the biceps, triceps, brachioradialis, patella and achilles.   Hoffman's is absent.   Bilateral upper and lower extremity sensation is intact to light touch.    No evidence of dysmetria noted.  Gait is stooped.     Medical Decision Making  Imaging: MRI L spine 08/23/2022 Disc levels:   T12-L1: No significant disc protrusion, foraminal stenosis, or canal stenosis.   L1-L2: Minimal disc bulge. Mild bilateral facet hypertrophy. No significant foraminal stenosis. No canal stenosis.   L2-L3: Minimal disc bulge and mild bilateral facet arthropathy. No significant foraminal stenosis. No canal stenosis.   L3-L4: Disc bulge with moderate bilateral facet arthropathy and ligamentum flavum buckling. There is severe canal stenosis with mild-to-moderate bilateral foraminal stenosis.   L4-L5: Mild annular disc bulge and mild bilateral facet arthropathy. Mild left foraminal stenosis. No canal stenosis.   L5-S1: Disc height loss with endplate ridging. Mild bilateral facet arthropathy. Mild left foraminal stenosis. No canal stenosis.   IMPRESSION: 1. Multilevel lumbar spondylosis, most pronounced at the L3-L4 level where there is severe canal stenosis and mild-to-moderate bilateral foraminal stenosis. 2. Chronic superior endplate compression deformity of L1. No acute osseous findings. 3. Partially imaged cystic structure within the right pelvis measuring  at least 4 cm in size. Pelvic ultrasound is recommended for further evaluation.     Electronically Signed   By: Nicholas  Plundo D.O.   On: 08/24/2022 10:07  I have personally reviewed the images and agree with the above interpretation.  Assessment and Plan: Ms. Ewings is a pleasant 71 y.o. 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  She has now tried and failed conservative management and has worsening symptoms.  I have recommended surgical intervention with an L3-4 decompression.  I discussed the planned procedure at length with the patient, including the risks, benefits, alternatives, and indications. The risks discussed include but are not limited to bleeding, infection, need for reoperation, spinal fluid leak, stroke, vision loss, anesthetic complication, coma, paralysis, and even death. I also described in detail that improvement was not guaranteed.  The patient expressed understanding of these risks, and asked that we proceed with surgery. I described the surgery in layman's terms, and gave ample opportunity for questions, which were answered to the best of my ability.    Thank you for involving me in the care   of this patient.      [place] Jon K. Danyah Guastella MD, MPHS Neurosurgery 

## 2022-11-27 NOTE — Patient Instructions (Signed)
Please see below for information in regards to your upcoming surgery:   Planned surgery: L3-4 decompression   Surgery date: 12/12/22 - you will find out your arrival time the business day before your surgery.   Pre-op appointment at [facility] Pre-admit Testing: we will call you with a date/time for this. Pre-admit testing is located on the first floor of the [facility], [address] [facility] [address], [address]. Please bring all prescriptions in the original prescription bottles to your appointment, even if you have reviewed medications by phone with a pharmacy representative. Pre-op labs may be done at your pre-op appointment. You are not required to fast for these labs. Should you need to change your pre-op appointment, please call Pre-admit testing at [phone].     We can be reached by phone or mychart 8am-4pm, Monday-Friday. If you have any questions/concerns before or after surgery, you can reach [place] at [phone], or you can send a mychart message. If you have a concern after hours that cannot wait until normal business hours, you can call [phone] and ask to page the neurosurgeon on call for [place].   Appointments/FMLA & disability paperwork: [facility]  Nurse: Royston Cowper  Medical assistants: Laurann Montana Physician Assistant's: Manning Charity & Drake Leach Surgeon: Venetia Night, MD

## 2022-11-27 NOTE — Progress Notes (Signed)
Referring Physician:  Kandyce Rud, MD [phone] [address] [facility] [place],  [place] [address]  Primary Physician:  Kandyce Rud, MD  History of Present Illness: 11/27/2022  Joy Hernandez returns to see me.  She has been doing physical therapy since I last saw her.  Her symptoms have worsened and are now impacting her left leg as well.  She is very frustrated with her lack of improvement.  09/25/2022 Ms. Joy Hernandez is here today with a chief complaint of low back and right lower extremity pain.  Her pain is in her buttock and in her posterior thigh.  She also has discomfort in her anterior calf.  She has trouble with walking and has pain almost immediately upon standing.  She is bent forward.  Sitting and laying down make it better.  Bowel/Bladder Dysfunction: none  Conservative measures:  Physical therapy:  has participated in at [facility] 08/14/22-08/23/22 without relief  Multimodal medical therapy including regular antiinflammatories:  tylenol, advil, gabapentin Injections:  has received epidural steroid injections 09/07/22: Bilateral L4-5 TF ESI by Dr. Mariah Milling (helped for 1 day)  Past Surgery: denies  Joy Hernandez has no symptoms of cervical myelopathy.  The symptoms are causing a significant impact on the patient's life.   I have utilized the care everywhere function in epic to review the outside records available from external health systems.  Review of Systems:  A 10 point review of systems is negative, except for the pertinent positives and negatives detailed in the HPI.  Past Medical History: Past Medical History:  Diagnosis Date   Adenocarcinoma in situ    appendix   Arthritis    Barrett's esophagus    Cancer    appendix   GERD (gastroesophageal reflux disease)    History of colonic polyps    Hyperlipidemia    Hypertension    IBS (irritable bowel syndrome)     Past Surgical History: Past Surgical History:  Procedure  Laterality Date   APPENDECTOMY     BREAST BIOPSY Left 1990   needle bx- neg   CHOLECYSTECTOMY     COLONOSCOPY WITH PROPOFOL N/A 09/02/2018   Procedure: COLONOSCOPY WITH PROPOFOL;  Surgeon: Toledo, Boykin Nearing, MD;  Location: [facility] ENDOSCOPY;  Service: Gastroenterology;  Laterality: N/A;   ESOPHAGOGASTRODUODENOSCOPY N/A 03/31/2015   Procedure: ESOPHAGOGASTRODUODENOSCOPY (EGD);  Surgeon: Wallace Cullens, MD;  Location: [facility] ENDOSCOPY;  Service: Gastroenterology;  Laterality: N/A;   ESOPHAGOGASTRODUODENOSCOPY (EGD) WITH PROPOFOL N/A 09/02/2018   Procedure: ESOPHAGOGASTRODUODENOSCOPY (EGD) WITH PROPOFOL;  Surgeon: Toledo, Boykin Nearing, MD;  Location: [facility] ENDOSCOPY;  Service: Gastroenterology;  Laterality: N/A;   Excision compound atypical nevus left shoulder  08/19/2007   Left salpingectomy     TUBAL LIGATION      Allergies: Allergies as of 11/27/2022 - Review Complete 11/27/2022  Allergen Reaction Noted   Morphine and related  09/01/2018   Morphine sulfate Rash 07/07/2018   Sulfa antibiotics Other (See Comments) and Rash 03/11/2014    Medications: No outpatient medications have been marked as taking for the 11/27/22 encounter (Office Visit) with Venetia Night, MD.    Social History: Social History   Tobacco Use   Smoking status: Former    Types: Cigarettes    Quit date: 01/04/2017    Years since quitting: 5.8   Smokeless tobacco: Never  Vaping Use   Vaping Use: Never used  Substance Use Topics   Alcohol use: Not Currently   Drug use: Never  Family Medical History: Family History  Problem Relation Age of Onset   Breast cancer Sister 40    Physical Examination: Vitals:   11/27/22 1021  BP: (!) 186/88  Pulse: 61    General: Patient is well developed, well nourished, calm, collected, and in no apparent distress. Attention to examination is appropriate.  Neck:   Supple.  Full range of motion.  Respiratory: Patient is breathing without any difficulty.   NEUROLOGICAL:      Awake, alert, oriented to person, place, and time.  Speech is clear and fluent.   Cranial Nerves: Pupils equal round and reactive to light.  Facial tone is symmetric.  Facial sensation is symmetric. Shoulder shrug is symmetric. Tongue protrusion is midline.  There is no pronator drift.  ROM of spine: full.    Strength: Side Biceps Triceps Deltoid Interossei Grip Wrist Ext. Wrist Flex.  R L Side Iliopsoas Quads Hamstring PF DF EHL  R L Reflexes are 1+ and symmetric at the biceps, triceps, brachioradialis, patella and achilles.   Hoffman's is absent.   Bilateral upper and lower extremity sensation is intact to light touch.    No evidence of dysmetria noted.  Gait is stooped.     Medical Decision Making  Imaging: MRI L spine 08/23/2022 Disc levels:   T12-L1: No significant disc protrusion, foraminal stenosis, or canal stenosis.   L1-L2: Minimal disc bulge. Mild bilateral facet hypertrophy. No significant foraminal stenosis. No canal stenosis.   L2-L3: Minimal disc bulge and mild bilateral facet arthropathy. No significant foraminal stenosis. No canal stenosis.   L3-L4: Disc bulge with moderate bilateral facet arthropathy and ligamentum flavum buckling. There is severe canal stenosis with mild-to-moderate bilateral foraminal stenosis.   L4-L5: Mild annular disc bulge and mild bilateral facet arthropathy. Mild left foraminal stenosis. No canal stenosis.   L5-S1: Disc height loss with endplate ridging. Mild bilateral facet arthropathy. Mild left foraminal stenosis. No canal stenosis.   IMPRESSION: 1. Multilevel lumbar spondylosis, most pronounced at the L3-L4 level where there is severe canal stenosis and mild-to-moderate bilateral foraminal stenosis. 2. Chronic superior endplate compression deformity of L1. No acute osseous findings. 3. Partially imaged cystic structure within the right pelvis measuring  at least 4 cm in size. Pelvic ultrasound is recommended for further evaluation.     Electronically Signed   By: Duanne Guess D.O.   On: 08/24/2022 10:07  I have personally reviewed the images and agree with the above interpretation.  Assessment and Plan: Joy Hernandez is a pleasant 72 y.o. 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  She has now tried and failed conservative management and has worsening symptoms.  I have recommended surgical intervention with an L3-4 decompression.  I discussed the planned procedure at length with the patient, including the risks, benefits, alternatives, and indications. The risks discussed include but are not limited to bleeding, infection, need for reoperation, spinal fluid leak, stroke, vision loss, anesthetic complication, coma, paralysis, and even death. I also described in detail that improvement was not guaranteed.  The patient expressed understanding of these risks, and asked that we proceed with surgery. I described the surgery in layman's terms, and gave ample opportunity for questions, which were answered to the best of my ability.    Thank you for involving me in the care  of this patient.      Sani Loiseau K. Myer Haff MD, [facility] Neurosurgery

## 2022-12-03 ENCOUNTER — Other Ambulatory Visit: Payer: Self-pay

## 2022-12-03 ENCOUNTER — Encounter
Admission: RE | Admit: 2022-12-03 | Discharge: 2022-12-03 | Disposition: A | Discharge status: Home / Self Care | Payer: Medicare Other | Source: Ambulatory Visit | Attending: Neurosurgery | Admitting: Neurosurgery

## 2022-12-03 DIAGNOSIS — Z01818 Encounter for other preprocedural examination: Secondary | ICD-10-CM | POA: Insufficient documentation

## 2022-12-03 HISTORY — DX: Hypothyroidism, unspecified: E03.9

## 2022-12-03 HISTORY — DX: Family history of other specified conditions: Z84.89

## 2022-12-03 LAB — CBC
HCT: 40.3 % (ref 36.0–46.0)
Hemoglobin: 13.9 g/dL (ref 12.0–15.0)
MCH: 32.9 pg (ref 26.0–34.0)
MCHC: 34.5 g/dL (ref 30.0–36.0)
MCV: 95.5 fL (ref 80.0–100.0)
Platelets: 245 10*3/uL (ref 150–400)
RBC: 4.22 MIL/uL (ref 3.87–5.11)
RDW: 13.2 % (ref 11.5–15.5)
WBC: 7.3 10*3/uL (ref 4.0–10.5)
nRBC: 0 % (ref 0.0–0.2)

## 2022-12-03 LAB — TYPE AND SCREEN
ABO/RH(D): O POS
Antibody Screen: NEGATIVE

## 2022-12-03 LAB — BASIC METABOLIC PANEL
Anion gap: 9 (ref 5–15)
BUN: 13 mg/dL (ref 8–23)
CO2: 29 mmol/L (ref 22–32)
Calcium: 8.9 mg/dL (ref 8.9–10.3)
Chloride: 101 mmol/L (ref 98–111)
Creatinine, Ser: 0.57 mg/dL (ref 0.44–1.00)
GFR, Estimated: >60 mL/min (ref >60)
Glucose, Bld: 84 mg/dL (ref 70–99)
Potassium: 3.8 mmol/L (ref 3.5–5.1)
Sodium: 139 mmol/L (ref 135–145)

## 2022-12-03 LAB — URINALYSIS, ROUTINE W REFLEX MICROSCOPIC
Bilirubin Urine: NEGATIVE
Glucose, UA: NEGATIVE mg/dL
Hgb urine dipstick: NEGATIVE
Ketones, ur: NEGATIVE mg/dL
Leukocytes,Ua: NEGATIVE
Nitrite: NEGATIVE
Protein, ur: NEGATIVE mg/dL
Specific Gravity, Urine: 1.004 — ABNORMAL LOW (ref 1.005–1.030)
pH: 6 (ref 5.0–8.0)

## 2022-12-03 LAB — SURGICAL PCR SCREEN
MRSA, PCR: NEGATIVE
Staphylococcus aureus: NEGATIVE

## 2022-12-03 NOTE — Patient Instructions (Signed)
Your procedure is scheduled on: Wednesday 12/12/22 To find out your arrival time, please call [phone] between 1PM - 3PM on:   Tuesday 12/11/22 Report to the Registration Desk on the 1st floor of the [facility]. Valet parking is available.  If your arrival time is 6:00 am, do not arrive before that time as the [facility] entrance doors do not open until 6:00 am.  REMEMBER: Instructions that are not followed completely may result in serious medical risk, up to and including death; or upon the discretion of your surgeon and anesthesiologist your surgery may need to be rescheduled.  Do not eat food after midnight the night before surgery.  No gum chewing or hard candies.  You may however, drink CLEAR liquids up to 2 hours before you are scheduled to arrive for your surgery. Do not drink anything within 2 hours of your scheduled arrival time.  Clear liquids include: - water  - apple juice without pulp - gatorade (not RED colors) - black coffee or tea (Do NOT add milk or creamers to the coffee or tea) Do NOT drink anything that is not on this list.  Type 1 and Type 2 diabetics should only drink water.  One week prior to surgery: Stop Anti-inflammatories (NSAIDS) such as Advil, Aleve, Ibuprofen, Motrin, Naproxen, Naprosyn and Aspirin based products such as Excedrin, Goody's Powder, BC Powder. You may however, continue to take Tylenol if needed for pain up until the day of surgery.  Stop ANY OVER THE COUNTER supplements or vitamins until after surgery.  Continue taking all prescribed medications.   TAKE ONLY THESE MEDICATIONS THE MORNING OF SURGERY WITH A SIP OF WATER:  cetirizine (ZYRTEC) 10 MG  levothyroxine (SYNTHROID) 50 MCG tablet  simvastatin (ZOCOR) 40 MG tablet  pantoprazole (PROTONIX) 40 MG tablet Antacid (take one the night before and one on the morning of surgery - helps to prevent nausea after surgery.)  No Alcohol for 24 hours before or after surgery.  No Smoking  including e-cigarettes for 24 hours before surgery.  No chewable tobacco products for at least 6 hours before surgery.  No nicotine patches on the day of surgery.  Do not use any "recreational" drugs for at least a week (preferably 2 weeks) before your surgery.  Please be advised that the combination of cocaine and anesthesia may have negative outcomes, up to and including death. If you test positive for cocaine, your surgery will be cancelled.  On the morning of surgery brush your teeth with toothpaste and water, you may rinse your mouth with mouthwash if you wish. Do not swallow any toothpaste or mouthwash.  Use CHG Soap or wipes as directed on instruction sheet.  Do not wear lotions, powders, or perfumes.   Do not shave body hair from the neck down 48 hours before surgery.  Wear comfortable clothing (specific to your surgery type) to the hospital.  Do not wear jewelry, make-up, hairpins, clips or nail polish.  Contact lenses, hearing aids and dentures may not be worn into surgery.  Do not bring valuables to the hospital. [facility] is not responsible for any missing/lost belongings or valuables.   Notify your doctor if there is any change in your medical condition (cold, fever, infection).  If you are being discharged the day of surgery, you will not be allowed to drive home. You will need a responsible individual to drive you home and stay with you for 24 hours after surgery.   If you are taking public  transportation, you will need to have a responsible individual with you.  If you are being admitted to the hospital overnight, leave your suitcase in the car. After surgery it may be brought to your room.  In case of increased patient census, it may be necessary for you, the patient, to continue your postoperative care in the Same Day Surgery department.  After surgery, you can help prevent lung complications by doing breathing exercises.  Take deep breaths and cough every 1-2  hours. Your doctor may order a device called an Incentive Spirometer to help you take deep breaths. When coughing or sneezing, hold a pillow firmly against your incision with both hands. This is called "splinting." Doing this helps protect your incision. It also decreases belly discomfort.  Surgery Visitation Policy:  Patients undergoing a surgery or procedure may have two family members or support persons with them as long as the person is not COVID-19 positive or experiencing its symptoms.   Inpatient Visitation:    Visiting hours are 7 a.m. to 8 p.m. Up to four visitors are allowed at one time in a patient room. The visitors may rotate out with other people during the day. One designated support person (adult) may remain overnight.  Please call the Pre-admissions Testing Dept. at [phone] if you have any questions about these instructions.

## 2022-12-12 ENCOUNTER — Other Ambulatory Visit: Payer: Self-pay

## 2022-12-12 ENCOUNTER — Ambulatory Visit: Payer: Medicare Other

## 2022-12-12 ENCOUNTER — Encounter: Payer: Self-pay | Admitting: Neurosurgery

## 2022-12-12 ENCOUNTER — Ambulatory Visit
Admission: RE | Admit: 2022-12-12 | Discharge: 2022-12-12 | Disposition: A | Discharge status: Home / Self Care | Payer: Medicare Other | Attending: Neurosurgery | Admitting: Neurosurgery

## 2022-12-12 ENCOUNTER — Ambulatory Visit: Payer: Medicare Other | Admitting: Certified Registered Nurse Anesthetist

## 2022-12-12 ENCOUNTER — Ambulatory Visit: Payer: Medicare Other | Admitting: Urgent Care

## 2022-12-12 ENCOUNTER — Encounter
Admission: RE | Disposition: A | Discharge status: Home / Self Care | Payer: Self-pay | Source: Home / Self Care | Attending: Neurosurgery

## 2022-12-12 DIAGNOSIS — I10 Essential (primary) hypertension: Secondary | ICD-10-CM | POA: Diagnosis not present

## 2022-12-12 DIAGNOSIS — M4856XA Collapsed vertebra, not elsewhere classified, lumbar region, initial encounter for fracture: Secondary | ICD-10-CM | POA: Diagnosis not present

## 2022-12-12 DIAGNOSIS — Z87891 Personal history of nicotine dependence: Secondary | ICD-10-CM | POA: Diagnosis not present

## 2022-12-12 DIAGNOSIS — K219 Gastro-esophageal reflux disease without esophagitis: Secondary | ICD-10-CM | POA: Diagnosis not present

## 2022-12-12 DIAGNOSIS — M48062 Spinal stenosis, lumbar region with neurogenic claudication: Secondary | ICD-10-CM

## 2022-12-12 DIAGNOSIS — Z8719 Personal history of other diseases of the digestive system: Secondary | ICD-10-CM | POA: Diagnosis not present

## 2022-12-12 DIAGNOSIS — M47816 Spondylosis without myelopathy or radiculopathy, lumbar region: Secondary | ICD-10-CM | POA: Insufficient documentation

## 2022-12-12 DIAGNOSIS — Z85038 Personal history of other malignant neoplasm of large intestine: Secondary | ICD-10-CM | POA: Insufficient documentation

## 2022-12-12 DIAGNOSIS — Z01818 Encounter for other preprocedural examination: Secondary | ICD-10-CM

## 2022-12-12 HISTORY — PX: LUMBAR LAMINECTOMY/DECOMPRESSION MICRODISCECTOMY: SHX5026

## 2022-12-12 LAB — ABO/RH: ABO/RH(D): O POS

## 2022-12-12 SURGERY — LUMBAR LAMINECTOMY/DECOMPRESSION MICRODISCECTOMY 1 LEVEL
Anesthesia: General | Site: Spine Lumbar

## 2022-12-12 MED ORDER — SURGIFLO WITH THROMBIN (HEMOSTATIC MATRIX KIT) OPTIME
TOPICAL | Status: DC | PRN
  Administered 2022-12-12: 1 via TOPICAL

## 2022-12-12 MED ORDER — BUPIVACAINE HCL (PF) 0.5 % IJ SOLN
INTRAMUSCULAR | Status: AC
  Filled 2022-12-12: qty 60

## 2022-12-12 MED ORDER — CEFAZOLIN SODIUM-DEXTROSE 2-4 GM/100ML-% IV SOLN
2.0000 g | INTRAVENOUS | Status: AC
  Administered 2022-12-12: 2 g via INTRAVENOUS

## 2022-12-12 MED ORDER — SUCCINYLCHOLINE CHLORIDE 200 MG/10ML IV SOSY
PREFILLED_SYRINGE | INTRAVENOUS | Status: DC | PRN
  Administered 2022-12-12: 100 mg via INTRAVENOUS

## 2022-12-12 MED ORDER — SENNA 8.6 MG PO TABS: 1.0000 | ORAL_TABLET | Freq: Every day | ORAL | 0 refills | Status: DC | PRN

## 2022-12-12 MED ORDER — SUCCINYLCHOLINE CHLORIDE 200 MG/10ML IV SOSY
PREFILLED_SYRINGE | INTRAVENOUS | Status: AC
  Filled 2022-12-12: qty 10

## 2022-12-12 MED ORDER — EPHEDRINE SULFATE (PRESSORS) 50 MG/ML IJ SOLN
INTRAMUSCULAR | Status: DC | PRN
  Administered 2022-12-12 (×5): 5 mg via INTRAVENOUS

## 2022-12-12 MED ORDER — ONDANSETRON HCL 4 MG/2ML IJ SOLN: 4.0000 mg | Freq: Once | INTRAMUSCULAR | Status: DC | PRN

## 2022-12-12 MED ORDER — FENTANYL CITRATE (PF) 100 MCG/2ML IJ SOLN: 25.0000 ug | INTRAMUSCULAR | Status: DC | PRN

## 2022-12-12 MED ORDER — SODIUM CHLORIDE (PF) 0.9 % IJ SOLN
INTRAMUSCULAR | Status: DC | PRN
  Administered 2022-12-12: 50 mL via INTRAMUSCULAR

## 2022-12-12 MED ORDER — CHLORHEXIDINE GLUCONATE 0.12 % MT SOLN
OROMUCOSAL | Status: AC
  Filled 2022-12-12: qty 15

## 2022-12-12 MED ORDER — CEFAZOLIN SODIUM-DEXTROSE 2-4 GM/100ML-% IV SOLN
INTRAVENOUS | Status: AC
  Filled 2022-12-12: qty 100

## 2022-12-12 MED ORDER — METHOCARBAMOL 500 MG PO TABS: 500.0000 mg | ORAL_TABLET | Freq: Three times a day (TID) | ORAL | 0 refills | Status: DC | PRN

## 2022-12-12 MED ORDER — LACTATED RINGERS IV SOLN: INTRAVENOUS | Status: DC

## 2022-12-12 MED ORDER — ONDANSETRON HCL 4 MG/2ML IJ SOLN
INTRAMUSCULAR | Status: DC | PRN
  Administered 2022-12-12: 4 mg via INTRAVENOUS

## 2022-12-12 MED ORDER — OXYCODONE HCL 5 MG PO TABS
5.0000 mg | ORAL_TABLET | Freq: Once | ORAL | Status: AC | PRN
  Administered 2022-12-12: 5 mg via ORAL

## 2022-12-12 MED ORDER — PHENYLEPHRINE HCL-NACL 20-0.9 MG/250ML-% IV SOLN
INTRAVENOUS | Status: AC
  Filled 2022-12-12: qty 250

## 2022-12-12 MED ORDER — BUPIVACAINE-EPINEPHRINE (PF) 0.5% -1:200000 IJ SOLN
INTRAMUSCULAR | Status: DC | PRN
  Administered 2022-12-12: 3 mL via PERINEURAL

## 2022-12-12 MED ORDER — KETOROLAC TROMETHAMINE 30 MG/ML IJ SOLN
INTRAMUSCULAR | Status: DC | PRN
  Administered 2022-12-12: 30 mg via INTRAVENOUS

## 2022-12-12 MED ORDER — DEXAMETHASONE SODIUM PHOSPHATE 10 MG/ML IJ SOLN
INTRAMUSCULAR | Status: DC | PRN
  Administered 2022-12-12: 10 mg via INTRAVENOUS

## 2022-12-12 MED ORDER — PHENYLEPHRINE HCL-NACL 20-0.9 MG/250ML-% IV SOLN
INTRAVENOUS | Status: DC | PRN
  Administered 2022-12-12: 80 ug via INTRAVENOUS

## 2022-12-12 MED ORDER — CEFAZOLIN IN SODIUM CHLORIDE 2-0.9 GM/100ML-% IV SOLN
2.0000 g | Freq: Once | INTRAVENOUS | Status: DC
  Filled 2022-12-12: qty 100

## 2022-12-12 MED ORDER — FENTANYL CITRATE (PF) 100 MCG/2ML IJ SOLN
INTRAMUSCULAR | Status: AC
  Filled 2022-12-12: qty 2

## 2022-12-12 MED ORDER — PROPOFOL 10 MG/ML IV BOLUS
INTRAVENOUS | Status: DC | PRN
  Administered 2022-12-12: 40 mg via INTRAVENOUS
  Administered 2022-12-12: 140 mg via INTRAVENOUS

## 2022-12-12 MED ORDER — BUPIVACAINE LIPOSOME 1.3 % IJ SUSP
INTRAMUSCULAR | Status: AC
  Filled 2022-12-12: qty 20

## 2022-12-12 MED ORDER — OXYCODONE HCL 5 MG/5ML PO SOLN: 5.0000 mg | Freq: Once | ORAL | Status: AC | PRN

## 2022-12-12 MED ORDER — METHYLPREDNISOLONE ACETATE 40 MG/ML IJ SUSP
INTRAMUSCULAR | Status: AC
  Filled 2022-12-12: qty 1

## 2022-12-12 MED ORDER — MIDAZOLAM HCL 2 MG/2ML IJ SOLN
INTRAMUSCULAR | Status: AC
  Filled 2022-12-12: qty 2

## 2022-12-12 MED ORDER — METHYLPREDNISOLONE ACETATE 40 MG/ML IJ SUSP
INTRAMUSCULAR | Status: DC | PRN
  Administered 2022-12-12: 40 mg

## 2022-12-12 MED ORDER — MIDAZOLAM HCL 2 MG/2ML IJ SOLN
INTRAMUSCULAR | Status: DC | PRN
  Administered 2022-12-12: 1 mg via INTRAVENOUS

## 2022-12-12 MED ORDER — GLYCOPYRROLATE 0.2 MG/ML IJ SOLN
INTRAMUSCULAR | Status: DC | PRN
  Administered 2022-12-12: .2 mg via INTRAVENOUS

## 2022-12-12 MED ORDER — LIDOCAINE HCL (CARDIAC) PF 100 MG/5ML IV SOSY
PREFILLED_SYRINGE | INTRAVENOUS | Status: DC | PRN
  Administered 2022-12-12: 100 mg via INTRAVENOUS

## 2022-12-12 MED ORDER — ORAL CARE MOUTH RINSE: 15.0000 mL | Freq: Once | OROMUCOSAL | Status: AC

## 2022-12-12 MED ORDER — ACETAMINOPHEN 10 MG/ML IV SOLN
INTRAVENOUS | Status: DC | PRN
  Administered 2022-12-12: 1000 mg via INTRAVENOUS

## 2022-12-12 MED ORDER — 0.9 % SODIUM CHLORIDE (POUR BTL) OPTIME
TOPICAL | Status: DC | PRN
  Administered 2022-12-12: 500 mL

## 2022-12-12 MED ORDER — SODIUM CHLORIDE FLUSH 0.9 % IV SOLN
INTRAVENOUS | Status: AC
  Filled 2022-12-12: qty 20

## 2022-12-12 MED ORDER — CHLORHEXIDINE GLUCONATE 0.12 % MT SOLN
15.0000 mL | Freq: Once | OROMUCOSAL | Status: AC
  Administered 2022-12-12: 15 mL via OROMUCOSAL

## 2022-12-12 MED ORDER — OXYCODONE HCL 5 MG PO TABS
ORAL_TABLET | ORAL | Status: AC
  Filled 2022-12-12: qty 1

## 2022-12-12 MED ORDER — FENTANYL CITRATE (PF) 100 MCG/2ML IJ SOLN
INTRAMUSCULAR | Status: DC | PRN
  Administered 2022-12-12 (×2): 50 ug via INTRAVENOUS

## 2022-12-12 MED ORDER — ACETAMINOPHEN 10 MG/ML IV SOLN
INTRAVENOUS | Status: AC
  Filled 2022-12-12: qty 100

## 2022-12-12 MED ORDER — ACETAMINOPHEN 10 MG/ML IV SOLN: 1000.0000 mg | Freq: Once | INTRAVENOUS | Status: DC | PRN

## 2022-12-12 MED ORDER — OXYCODONE HCL 5 MG PO TABS: 5.0000 mg | ORAL_TABLET | ORAL | 0 refills | Status: DC | PRN

## 2022-12-12 MED ORDER — EPINEPHRINE PF 1 MG/ML IJ SOLN
INTRAMUSCULAR | Status: AC
  Filled 2022-12-12: qty 1

## 2022-12-12 SURGICAL SUPPLY — 46 items
ADH SKN CLS APL DERMABOND .7 (GAUZE/BANDAGES/DRESSINGS) ×1
AGENT HMST KT MTR STRL THRMB (HEMOSTASIS) ×1
APL PRP STRL LF DISP 70% ISPRP (MISCELLANEOUS) ×1
BASIN KIT SINGLE STR (MISCELLANEOUS) ×1 IMPLANT
BUR NEURO DRILL SOFT 3.0X3.8M (BURR) ×1 IMPLANT
CHLORAPREP W/TINT 26 (MISCELLANEOUS) ×1 IMPLANT
CNTNR URN SCR LID CUP LEK RST (MISCELLANEOUS) ×1 IMPLANT
CONT SPEC 4OZ STRL OR WHT (MISCELLANEOUS) ×1
DERMABOND ADVANCED .7 DNX12 (GAUZE/BANDAGES/DRESSINGS) ×1 IMPLANT
DRAPE C ARM PK CFD 31 SPINE (DRAPES) ×1 IMPLANT
DRAPE LAPAROTOMY 100X77 ABD (DRAPES) ×1 IMPLANT
DRAPE MICROSCOPE SPINE 48X150 (DRAPES) ×1 IMPLANT
DRAPE SURG 17X11 SM STRL (DRAPES) ×1 IMPLANT
ELECT EZSTD 165MM 6.5IN (MISCELLANEOUS)
ELECT REM PT RETURN 9FT ADLT (ELECTROSURGICAL) ×1
ELECTRODE EZSTD 165MM 6.5IN (MISCELLANEOUS) IMPLANT
ELECTRODE REM PT RTRN 9FT ADLT (ELECTROSURGICAL) ×1 IMPLANT
GLOVE BIOGEL PI IND STRL 6.5 (GLOVE) ×1 IMPLANT
GLOVE BIOGEL PI IND STRL 8.5 (GLOVE) ×2 IMPLANT
GLOVE SURG SYN 6.5 ES PF (GLOVE) ×2 IMPLANT
GLOVE SURG SYN 6.5 PF PI (GLOVE) ×2 IMPLANT
GLOVE SURG SYN 8.5  E (GLOVE) ×3
GLOVE SURG SYN 8.5 E (GLOVE) ×3 IMPLANT
GLOVE SURG SYN 8.5 PF PI (GLOVE) ×3 IMPLANT
GOWN SRG LRG LVL 4 IMPRV REINF (GOWNS) ×1 IMPLANT
GOWN SRG XL LVL 3 NONREINFORCE (GOWNS) ×1 IMPLANT
GOWN STRL NON-REIN TWL XL LVL3 (GOWNS) ×1
GOWN STRL REIN LRG LVL4 (GOWNS) ×1
KIT SPINAL PRONEVIEW (KITS) ×1 IMPLANT
MANIFOLD NEPTUNE II (INSTRUMENTS) ×1 IMPLANT
MARKER SKIN DUAL TIP RULER LAB (MISCELLANEOUS) ×1 IMPLANT
NDL SAFETY ECLIP 18X1.5 (MISCELLANEOUS) ×1 IMPLANT
NS IRRIG 1000ML POUR BTL (IV SOLUTION) ×1 IMPLANT
NS IRRIG 500ML POUR BTL (IV SOLUTION) IMPLANT
PACK LAMINECTOMY NEURO (CUSTOM PROCEDURE TRAY) ×1 IMPLANT
PAD ARMBOARD 7.5X6 YLW CONV (MISCELLANEOUS) ×1 IMPLANT
SURGIFLO W/THROMBIN 8M KIT (HEMOSTASIS) ×1 IMPLANT
SUT DVC VLOC 3-0 CL 6 P-12 (SUTURE) ×1 IMPLANT
SUT VIC AB 0 CT1 27 (SUTURE) ×1
SUT VIC AB 0 CT1 27XCR 8 STRN (SUTURE) ×1 IMPLANT
SUT VIC AB 2-0 CT1 18 (SUTURE) ×1 IMPLANT
SYR 10ML LL (SYRINGE) ×2 IMPLANT
SYR 30ML LL (SYRINGE) ×2 IMPLANT
SYR 3ML LL SCALE MARK (SYRINGE) ×1 IMPLANT
TRAP FLUID SMOKE EVACUATOR (MISCELLANEOUS) ×1 IMPLANT
WATER STERILE IRR 1000ML POUR (IV SOLUTION) ×2 IMPLANT

## 2022-12-12 NOTE — Anesthesia Postprocedure Evaluation (Signed)
Anesthesia Post Note  Patient: Joy Hernandez  Procedure(s) Performed: L3-4 DECOMPRESSION (Spine Lumbar)  Patient location during evaluation: PACU Anesthesia Type: General Level of consciousness: awake and alert, oriented and patient cooperative Pain management: pain level controlled Vital Signs Assessment: post-procedure vital signs reviewed and stable Respiratory status: spontaneous breathing, nonlabored ventilation and respiratory function stable Cardiovascular status: blood pressure returned to baseline and stable Postop Assessment: adequate PO intake Anesthetic complications: no   No notable events documented.   Last Vitals:  Vitals:   12/12/22 1215 12/12/22 1233  BP: 118/67 (!) 151/77  Pulse: 74 65  Resp: (!) 24   Temp:  (!) 36.1 C  SpO2: 96% 97%    Last Pain:  Vitals:   12/12/22 1233  TempSrc: Tympanic  PainSc:                  Reed Breech

## 2022-12-12 NOTE — Discharge Summary (Signed)
Discharge Summary  Patient ID: JAEANA PERKES MRN: [id number] DOB/AGE: 03-20-1951 72 y.o.  Admit date: 12/12/2022 Discharge date: 12/12/2022  Admission Diagnoses: lumbar stenosis causing neurogenic claudication (ICD10 M48.062)  Discharge Diagnoses:  Active Problems:   Neurogenic claudication due to lumbar spinal stenosis   Discharged Condition: good  Hospital Course:  Joy Hernandez is a 72 y.o presenting with symptomatic lumbar stenosis s/p L3-4 decompression. Her intraoperative course was uncomplicated. She was monitored in PACU and discharged home after ambulating, urinating, and tolerating PO intake. She was given prescriptions for Robaxin, Senna, and Oxycodone  Consults: None  Significant Diagnostic Studies: none  Treatments: surgery: as above. Please see separately dictated operative report for further details   Discharge Exam: Blood pressure (!) 151/77, pulse 65, temperature (!) 97 F (36.1 C), temperature source Tympanic, resp. rate (!) 24, height 5' 3.5" (1.613 m), weight 86.6 kg, SpO2 97 %. CN II-XII grossly intact 5/5 throughout BLE Incision covered with post-op bandage  Disposition: Discharge disposition: 01-Home or Self Care        Allergies as of 12/12/2022       Reactions   Morphine And Related    Morphine Sulfate Rash   Pt denies   Sulfa Antibiotics Other (See Comments), Rash        Medication List     TAKE these medications    acetaminophen 500 MG tablet Commonly known as: TYLENOL Take 1,000 mg by mouth every 6 (six) hours as needed.   Alpha-Lipoic Acid 600 MG Caps Take 600 mg by mouth daily.   bisoprolol-hydrochlorothiazide 2.5-6.25 MG tablet Commonly known as: ZIAC Take 1 tablet by mouth daily.   cetirizine 10 MG chewable tablet Commonly known as: ZYRTEC Chew 10 mg by mouth daily.   Cholecalciferol 50 MCG (2000 UT) Caps Take 2,000 Units by mouth daily.   Cinnamon 500 MG capsule Take 500 mg by mouth daily.   Cranberry 500 MG  Caps Take by mouth daily.   cyanocobalamin 1000 MCG tablet Commonly known as: VITAMIN B12 Take 1,000 mcg by mouth daily.   docusate sodium 100 MG capsule Commonly known as: COLACE Take 200 mg by mouth daily.   GLUCOSAMINE 1500 COMPLEX PO Take by mouth daily.   levothyroxine 50 MCG tablet Commonly known as: SYNTHROID Take 50 mcg by mouth daily.   MAGNESIUM GLYCINATE PO Take 500 mg by mouth daily.   methocarbamol 500 MG tablet Commonly known as: ROBAXIN Take 1 tablet (500 mg total) by mouth every 8 (eight) hours as needed for muscle spasms.   multivitamin tablet Take 1 tablet by mouth daily.   omega-3 fish oil 1000 MG Caps capsule Commonly known as: MAXEPA Take 1 capsule by mouth daily.   oxyCODONE 5 MG immediate release tablet Commonly known as: Roxicodone Take 1 tablet (5 mg total) by mouth every 4 (four) hours as needed for severe pain.   pantoprazole 40 MG tablet Commonly known as: PROTONIX Take 40 mg by mouth daily.   PROBIOTIC PO Take by mouth daily.   senna 8.6 MG Tabs tablet Commonly known as: SENOKOT Take 1 tablet (8.6 mg total) by mouth daily as needed for mild constipation.   simvastatin 40 MG tablet Commonly known as: ZOCOR Take 40 mg by mouth daily.   Turmeric 500 MG Caps Take by mouth daily.   vitamin C 1000 MG tablet Take 1,000 mg by mouth daily.        Follow-up Information     Drake Leach, PA-C Follow up on 12/27/2022.  Specialty: Neurosurgery Why: [address] information: [address] [place] [address] [phone]                 Signed: Susanne Borders 12/12/2022, 4:10 PM

## 2022-12-12 NOTE — Discharge Instructions (Addendum)
Your surgeon has performed an operation on your lumbar spine (low back) to relieve pressure on one or more nerves. Many times, patients feel better immediately after surgery and can "overdo it." Even if you feel well, it is important that you follow these activity guidelines. If you do not let your back heal properly from the surgery, you can increase the chance of a disc herniation and/or return of your symptoms. The following are instructions to help in your recovery once you have been discharged from the hospital.  * It is ok to take NSAIDs after surgery.  Activity    No bending, lifting, or twisting ("BLT"). Avoid lifting objects heavier than 10 pounds (gallon milk jug).  Where possible, avoid household activities that involve lifting, bending, pushing, or pulling such as laundry, vacuuming, grocery shopping, and childcare. Try to arrange for help from friends and family for these activities while your back heals.  Increase physical activity slowly as tolerated.  Taking short walks is encouraged, but avoid strenuous exercise. Do not jog, run, bicycle, lift weights, or participate in any other exercises unless specifically allowed by your doctor. Avoid prolonged sitting, including car rides.  Talk to your doctor before resuming sexual activity.  You should not drive until cleared by your doctor.  Until released by your doctor, you should not return to work or school.  You should rest at home and let your body heal.   You may shower three days after your surgery.  After showering, lightly dab your incision dry. Do not take a tub bath or go swimming for 3 weeks, or until approved by your doctor at your follow-up appointment.  If you smoke, we strongly recommend that you quit.  Smoking has been proven to interfere with normal healing in your back and will dramatically reduce the success rate of your surgery. Please contact [facility] (800-QUIT-NOW) and use the resources at www.[facility].com for  assistance in stopping smoking.  Surgical Incision   If you have a dressing on your incision, you may remove it three days after your surgery. Keep your incision area clean and dry.  If you have staples or stitches on your incision, you should have a follow up scheduled for removal. If you do not have staples or stitches, you will have steri-strips (small pieces of surgical tape) or Dermabond glue. The steri-strips/glue should begin to peel away within about a week (it is fine if the steri-strips fall off before then). If the strips are still in place one week after your surgery, you may gently remove them.  Diet            You may return to your usual diet. Be sure to stay hydrated.  When to Contact Us  Although your surgery and recovery will likely be uneventful, you may have some residual numbness, aches, and pains in your back and/or legs. This is normal and should improve in the next few weeks.  However, should you experience any of the following, contact us immediately: New numbness or weakness Pain that is progressively getting worse, and is not relieved by your pain medications or rest Bleeding, redness, swelling, pain, or drainage from surgical incision Chills or flu-like symptoms Fever greater than 101.0 F (38.3 C) Problems with bowel or bladder functions Difficulty breathing or shortness of breath Warmth, tenderness, or swelling in your calf  Contact Information During office hours (Monday-Friday 9 am to 5 pm), please call your physician at [phone] and ask for Joy Hernandez After hours and   weekends, please call [phone] and speak with the neurosurgeon on call For a life-threatening emergency, call 911   AMBULATORY SURGERY  DISCHARGE INSTRUCTIONS   The drugs that you were given will stay in your system until tomorrow so for the next 24 hours you should not:  Drive an automobile Make any legal decisions Drink any alcoholic beverage   You may resume regular  meals tomorrow.  Today it is better to start with liquids and gradually work up to solid foods.  You may eat anything you prefer, but it is better to start with liquids, then soup and crackers, and gradually work up to solid foods.   Please notify your doctor immediately if you have any unusual bleeding, trouble breathing, redness and pain at the surgery site, drainage, fever, or pain not relieved by medication.    Additional Instructions:        Please contact your physician with any problems or Same Day Surgery at [phone], Monday through Friday 6 am to 4 pm, or [facility] Main number at [phone]. 

## 2022-12-12 NOTE — Transfer of Care (Signed)
Immediate Anesthesia Transfer of Care Note  Patient: Joy Hernandez  Procedure(s) Performed: L3-4 DECOMPRESSION (Spine Lumbar) PACU Patient Location: PACU  Anesthesia Type:General  Level of Consciousness: awake and drowsy  Airway & Oxygen Therapy: Patient Spontanous Breathing and Patient connected to face mask oxygen  Post-op Assessment: Report given to RN and Post -op Vital signs reviewed and stable  Post vital signs: Reviewed and stable  Last Vitals:  Vitals Value Taken Time  BP 132/68 12/12/22 1127  Temp    Pulse 81 12/12/22 1127  Resp 15 12/12/22 1127  SpO2 100 % 12/12/22 1127    Last Pain:  Vitals:   12/12/22 0750  TempSrc: Tympanic  PainSc: 0-No pain         Complications: No notable events documented.

## 2022-12-12 NOTE — Interval H&P Note (Signed)
History and Physical Interval Note:  12/12/2022 9:05 AM  Joy Hernandez  has presented today for surgery, with the diagnosis of M48.062 - Neurogenic claudication due to lumbar spinal stenosis.  The various methods of treatment have been discussed with the patient and family. After consideration of risks, benefits and other options for treatment, the patient has consented to  Procedure(s): L3-4 DECOMPRESSION (N/A) as a surgical intervention.  The patient's history has been reviewed, patient examined, no change in status, stable for surgery.  I have reviewed the patient's chart and labs.  Questions were answered to the patient's satisfaction.    Heart sounds normal no MRG. Chest Clear to Auscultation Bilaterally.   Joy Hernandez

## 2022-12-12 NOTE — Op Note (Signed)
Indications: Ms. 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 The patient tried and failed conservative management, prompting surgical intervention.  Findings: lumbar stenosis  Preoperative Diagnosis: Lumbar Stenosis with neurogenic claudication Postoperative Diagnosis: same   EBL: 10 ml IVF:  see AR ml Drains: none Disposition: Extubated and Stable to PACU Complications: none  No foley catheter was placed.   Preoperative Note:  Risks of surgery discussed include: infection, bleeding, stroke, coma, death, paralysis, CSF leak, nerve/spinal cord injury, numbness, tingling, weakness, complex regional pain syndrome, recurrent stenosis and/or disc herniation, vascular injury, development of instability, neck/back pain, need for further surgery, persistent symptoms, development of deformity, and the risks of anesthesia. The patient understood these risks and agreed to proceed.  Operative Note:   1. L3-4 lumbar decompression including central laminectomy and bilateral medial facetectomies including foraminotomies  The patient was then brought from the preoperative center with intravenous access established.  The patient underwent general anesthesia and endotracheal tube intubation, and was then rotated on the [place] rail top where all pressure points were appropriately padded.  The skin was then thoroughly cleansed.  Perioperative antibiotic prophylaxis was administered.  Sterile prep and drapes were then applied and a timeout was then observed.  C-arm was brought into the field under sterile conditions and under lateral visualization the L3-4 interspace was identified and marked.  The incision was marked on the righ and injected with local anesthetic. Once this was complete a 2 cm incision was opened with the use of a #10 blade knife.    The metrx tubes were sequentially advanced and confirmed in position at L3-4. An 18mm by 60mm tube was  locked in place to the bed side attachment.  The microscope was then sterilely brought into the field and muscle creep was hemostased with a bipolar and resected with a pituitary rongeur.  A Bovie extender was then used to expose the spinous process and lamina.  Careful attention was placed to not violate the facet capsule. A 3 mm matchstick drill bit was then used to make a hemi-laminotomy trough until the ligamentum flavum was exposed.  This was extended to the base of the spinous process and to the contralateral side to remove all the central bone from each side.  Once this was complete and the underlying ligamentum flavum was visualized, it was dissected with a curette and resected with Kerrison rongeurs.  Extensive ligamentum hypertrophy was noted, requiring a substantial amount of time and care for removal.  The dura was identified and palpated. The kerrison rongeur was then used to remove the medial facet bilaterally until no compression was noted.  A balltip probe was used to confirm decompression of the ipsilateral L4 nerve root.  Additional attention was paid to completion of the contralateral L3-4 foraminotomy until the contralateral traversing nerve root was completely free.  Once this was complete, L3-4 central decompression including medial facetectomy and foraminotomy was confirmed and decompression on both sides was confirmed. No CSF leak was noted.  Depo-Medrol was placed on the nerve root.  The wound was copiously irrigated. The tube system was then removed under microscopic visualization and hemostasis was obtained with a bipolar.    The fascial layer was reapproximated with the use of a 0 Vicryl suture.  Subcutaneous tissue layer was reapproximated using 2-0 Vicryl suture.  3-0 monocryl was placed in subcuticular fashion. The skin was then cleansed and Dermabond was used to close the skin opening.  Patient was then rotated back to  the preoperative bed awakened from anesthesia and taken to  recovery all counts are correct in this case.  I performed the entire procedure with the assistance of Manning Charity PA as an Designer, television/film set. An assistant was required for this procedure due to the complexity.  The assistant provided assistance in tissue manipulation and suction, and was required for the successful and safe performance of the procedure. I performed the critical portions of the procedure.   Christianne Zacher K. Myer Haff MD

## 2022-12-12 NOTE — Anesthesia Preprocedure Evaluation (Addendum)
Anesthesia Evaluation  Patient identified by MRN, date of birth, ID band Patient awake    Reviewed: Allergy & Precautions, NPO status , Patient's Chart, lab work & pertinent test results  History of Anesthesia Complications Negative for: history of anesthetic complications  Airway Mallampati: I   Neck ROM: Full    Dental  (+) Missing   Pulmonary former smoker (quit 2018)   Pulmonary exam normal breath sounds clear to auscultation       Cardiovascular hypertension, Normal cardiovascular exam Rhythm:Regular Rate:Normal  ECG 12/03/22:  Sinus bradycardia Low voltage QRS Septal infarct , age undetermined   Neuro/Psych Chronic lower back pain    GI/Hepatic ,GERD (Barrett esophagus)  ,,Appendiceal CA   Endo/Other  negative endocrine ROS    Renal/GU negative Renal ROS     Musculoskeletal  (+) Arthritis ,    Abdominal   Peds  Hematology negative hematology ROS (+)   Anesthesia Other Findings   Reproductive/Obstetrics                             Anesthesia Physical Anesthesia Plan  ASA: 2  Anesthesia Plan: General   Post-op Pain Management:    Induction: Intravenous  PONV Risk Score and Plan: 3 and Ondansetron, Dexamethasone and Treatment may vary due to age or medical condition  Airway Management Planned: Oral ETT  Additional Equipment:   Intra-op Plan:   Post-operative Plan: Extubation in OR  Informed Consent: I have reviewed the patients History and Physical, chart, labs and discussed the procedure including the risks, benefits and alternatives for the proposed anesthesia with the patient or authorized representative who has indicated his/her understanding and acceptance.     Dental advisory given  Plan Discussed with: CRNA  Anesthesia Plan Comments: (Patient consented for risks of anesthesia including but not limited to:  - adverse reactions to medications - damage to  eyes, teeth, lips or other oral mucosa - nerve damage due to positioning  - sore throat or hoarseness - damage to heart, brain, nerves, lungs, other parts of body or loss of life  Informed patient about role of CRNA in peri- and intra-operative care.  Patient voiced understanding.)        Anesthesia Quick Evaluation

## 2022-12-12 NOTE — Anesthesia Procedure Notes (Signed)
Procedure Name: Intubation Date/Time: 12/12/2022 10:08 AM  Performed by: Malva Cogan, CRNAPre-anesthesia Checklist: Patient identified, Patient being monitored, Timeout performed, Emergency Drugs available and Suction available Patient Re-evaluated:Patient Re-evaluated prior to induction Oxygen Delivery Method: Circle system utilized Preoxygenation: Pre-oxygenation with 100% oxygen Induction Type: IV induction Ventilation: Mask ventilation without difficulty Laryngoscope Size: 3 and McGraph Grade View: Grade I Tube type: Oral Tube size: 6.5 mm Number of attempts: 1 Airway Equipment and Method: Stylet and Video-laryngoscopy Placement Confirmation: ETT inserted through vocal cords under direct vision, positive ETCO2 and breath sounds checked- equal and bilateral Secured at: 21 cm Tube secured with: Tape Dental Injury: Teeth and Oropharynx as per pre-operative assessment

## 2022-12-13 ENCOUNTER — Encounter: Payer: Self-pay | Admitting: Neurosurgery

## 2022-12-18 ENCOUNTER — Other Ambulatory Visit: Payer: Self-pay | Admitting: Family Medicine

## 2022-12-18 DIAGNOSIS — N838 Other noninflammatory disorders of ovary, fallopian tube and broad ligament: Secondary | ICD-10-CM

## 2022-12-20 ENCOUNTER — Telehealth: Payer: Self-pay

## 2022-12-20 NOTE — Telephone Encounter (Signed)
Joy Hernandez called in to inquire if she can remove her bandage from surgery on 12/12/22. I advised her that she can remove it at this time. She inquired about post op incision care. We discussed that she can shower and clean the incision with mild soap and water and pat it dry, and that she does not need to cover the incision after.

## 2022-12-21 NOTE — Progress Notes (Unsigned)
   REFERRING PHYSICIAN:  Kandyce Rud, Md [address] [facility] [place],  [place] [address]  DOS: 12/12/22  L3-4 lumbar decompression including central laminectomy and bilateral medial facetectomies including foraminotomies   HISTORY OF PRESENT ILLNESS: Joy Hernandez is approximately 2 weeks status post L3-4 lumbar decompression including central laminectomy and bilateral medial facetectomies including foraminotomies . Was given oxycodone and robaxin on discharge from the hospital.   Her preop right leg pain is gone! She has minimal back pain. She is not taking oxycodone or robaxin.   She is feeling great!  PHYSICAL EXAMINATION:  General: Patient is well developed, well nourished, calm, collected, and in no apparent distress.   NEUROLOGICAL:  General: In no acute distress.   Awake, alert, oriented to person, place, and time.  Pupils equal round and reactive to light.  Facial tone is symmetric.     Strength:            Side Iliopsoas Quads Hamstring PF DF EHL  R 5 5 5 5 5 5   L 5 5 5 5 5 5    Incision c/d/i   ROS (Neurologic):  Negative except as noted above  IMAGING: Nothing new to review.   ASSESSMENT/PLAN:  Joy Hernandez is doing very well s/p above surgery. Treatment options reviewed with patient and following plan made:   - I have advised the patient to lift up to 10 pounds until 6 weeks after surgery (follow up with Dr. Myer Haff).  - Reviewed wound care.  - No bending, twisting, or lifting.  - She is not taking oxycodone of robaxin.  - Follow up as scheduled in 4 weeks and prn.   Advised to contact the office if any questions or concerns arise.  Drake Leach PA-C Department of neurosurgery

## 2022-12-27 ENCOUNTER — Ambulatory Visit (INDEPENDENT_AMBULATORY_CARE_PROVIDER_SITE_OTHER): Payer: Medicare Other | Admitting: Orthopedic Surgery

## 2022-12-27 ENCOUNTER — Encounter: Payer: Self-pay | Admitting: Orthopedic Surgery

## 2022-12-27 VITALS — BP 118/68 | Temp 100.0°F | Ht 63.0 in | Wt 190.0 lb

## 2022-12-27 DIAGNOSIS — M48062 Spinal stenosis, lumbar region with neurogenic claudication: Secondary | ICD-10-CM

## 2022-12-27 DIAGNOSIS — Z09 Encounter for follow-up examination after completed treatment for conditions other than malignant neoplasm: Secondary | ICD-10-CM

## 2022-12-27 DIAGNOSIS — Z9889 Other specified postprocedural states: Secondary | ICD-10-CM

## 2023-01-22 ENCOUNTER — Ambulatory Visit
Admission: RE | Admit: 2023-01-22 | Discharge: 2023-01-22 | Disposition: A | Discharge status: Home / Self Care | Payer: Medicare Other | Source: Ambulatory Visit | Attending: Family Medicine | Admitting: Family Medicine

## 2023-01-22 DIAGNOSIS — N838 Other noninflammatory disorders of ovary, fallopian tube and broad ligament: Secondary | ICD-10-CM

## 2023-01-24 ENCOUNTER — Encounter: Payer: Self-pay | Admitting: Neurosurgery

## 2023-01-24 ENCOUNTER — Ambulatory Visit (INDEPENDENT_AMBULATORY_CARE_PROVIDER_SITE_OTHER): Payer: Medicare Other | Admitting: Neurosurgery

## 2023-01-24 VITALS — BP 124/72 | Temp 98.1°F | Ht 63.0 in | Wt 190.0 lb

## 2023-01-24 DIAGNOSIS — M48062 Spinal stenosis, lumbar region with neurogenic claudication: Secondary | ICD-10-CM

## 2023-01-24 DIAGNOSIS — Z09 Encounter for follow-up examination after completed treatment for conditions other than malignant neoplasm: Secondary | ICD-10-CM

## 2023-01-24 NOTE — Progress Notes (Signed)
   REFERRING PHYSICIAN:  Kandyce Rud, Md [address] [facility] [place],  [place] [address]  DOS: 12/12/22  L3-4 lumbar decompression including central laminectomy and bilateral medial facetectomies including foraminotomies   HISTORY OF PRESENT ILLNESS: LANETRA HERRIGES is status post L3-4 lumbar decompression.  She is doing extremely well.  Her leg pain is gone.    PHYSICAL EXAMINATION:  General: Patient is well developed, well nourished, calm, collected, and in no apparent distress.   NEUROLOGICAL:  General: In no acute distress.   Awake, alert, oriented to person, place, and time.  Pupils equal round and reactive to light.  Facial tone is symmetric.     Strength:            Side Iliopsoas Quads Hamstring PF DF EHL  R 5 5 5 5 5 5   L 5 5 5 5 5 5    Incision c/d/i   ROS (Neurologic):  Negative except as noted above  IMAGING: Nothing new to review.   ASSESSMENT/PLAN:  SHULAMITH LORY is doing very well s/p above surgery.  I am very pleased with her response to surgery.  I have released her to start working with her home exercise tapes.  We discussed increasing activity and a current lifting limit of 25 pounds.  We will see her back in 6 weeks.    Venetia Night MD Department of neurosurgery

## 2023-03-01 NOTE — Progress Notes (Signed)
   REFERRING PHYSICIAN:  No referring provider defined for this encounter.  DOS: 12/12/22  L3-4 lumbar decompression including central laminectomy and bilateral medial facetectomies including foraminotomies   HISTORY OF PRESENT ILLNESS: She is almost 3 months out from her surgery.   She was doing well at her last visit with no pain in her right leg.   She continues to do well. She has some LBP with prolonged standing (over an hour). No right leg pain. No left leg pain. No numbness, tingling, or weakness. She notes intermittent pain in both knees.    PHYSICAL EXAMINATION:  General: Patient is well developed, well nourished, calm, collected, and in no apparent distress.   NEUROLOGICAL:  General: In no acute distress.   Awake, alert, oriented to person, place, and time.  Pupils equal round and reactive to light.  Facial tone is symmetric.     Strength:            Side Iliopsoas Quads Hamstring PF DF EHL  R 5 5 5 5 5 5   L 5 5 5 5 5 5    Incision well healed.    ROS (Neurologic):  Negative except as noted above  IMAGING: Nothing new to review.   ASSESSMENT/PLAN:  Joy Hernandez is doing very well s/p above surgery. Treatment options reviewed with patient and following plan made:   - She can return to activity as tolerated.  - Will follow up with ortho if knee pain gets worse.  - Follow up with Dr. Myer Haff in 6 months and prn.   Advised to contact the office if any questions or concerns arise.  Drake Leach PA-C Department of neurosurgery

## 2023-03-04 ENCOUNTER — Ambulatory Visit: Payer: Medicare Other | Admitting: Orthopedic Surgery

## 2023-03-04 ENCOUNTER — Encounter: Payer: Self-pay | Admitting: Orthopedic Surgery

## 2023-03-04 VITALS — BP 130/72 | Temp 98.4°F | Ht 63.0 in | Wt 198.2 lb

## 2023-03-04 DIAGNOSIS — Z09 Encounter for follow-up examination after completed treatment for conditions other than malignant neoplasm: Secondary | ICD-10-CM

## 2023-03-04 DIAGNOSIS — M48062 Spinal stenosis, lumbar region with neurogenic claudication: Secondary | ICD-10-CM

## 2023-03-04 DIAGNOSIS — Z9889 Other specified postprocedural states: Secondary | ICD-10-CM

## 2023-03-05 ENCOUNTER — Encounter: Payer: Medicare Other | Admitting: Orthopedic Surgery

## 2023-04-24 ENCOUNTER — Other Ambulatory Visit: Payer: Self-pay

## 2023-04-24 MED ORDER — FLUAD 0.5 ML IM SUSY
PREFILLED_SYRINGE | INTRAMUSCULAR | 0 refills | Status: DC
  Filled 2023-04-24: qty 0.5, 1d supply, fill #0

## 2023-04-24 MED ORDER — COMIRNATY 30 MCG/0.3ML IM SUSY
PREFILLED_SYRINGE | INTRAMUSCULAR | 0 refills | Status: DC
  Filled 2023-04-24: qty 0.3, 1d supply, fill #0

## 2023-05-06 ENCOUNTER — Other Ambulatory Visit: Payer: Self-pay

## 2023-06-21 ENCOUNTER — Other Ambulatory Visit: Payer: Self-pay | Admitting: Family Medicine

## 2023-06-21 DIAGNOSIS — Z1231 Encounter for screening mammogram for malignant neoplasm of breast: Secondary | ICD-10-CM

## 2023-08-12 ENCOUNTER — Ambulatory Visit: Payer: Medicare Other

## 2023-08-14 ENCOUNTER — Ambulatory Visit
Admission: RE | Admit: 2023-08-14 | Discharge: 2023-08-14 | Disposition: A | Discharge status: Home / Self Care | Payer: Medicare Other | Source: Ambulatory Visit | Attending: Family Medicine | Admitting: Family Medicine

## 2023-08-14 DIAGNOSIS — Z1231 Encounter for screening mammogram for malignant neoplasm of breast: Secondary | ICD-10-CM | POA: Diagnosis present

## 2023-08-27 ENCOUNTER — Encounter: Payer: Self-pay | Admitting: Ophthalmology

## 2023-08-27 ENCOUNTER — Ambulatory Visit: Payer: Medicare Other | Admitting: Neurosurgery

## 2023-08-27 ENCOUNTER — Encounter: Payer: Self-pay | Admitting: Neurosurgery

## 2023-08-27 ENCOUNTER — Other Ambulatory Visit: Payer: Self-pay

## 2023-08-27 VITALS — BP 118/72 | Ht 63.0 in | Wt 198.0 lb

## 2023-08-27 DIAGNOSIS — Z9889 Other specified postprocedural states: Secondary | ICD-10-CM | POA: Diagnosis not present

## 2023-08-27 DIAGNOSIS — M48062 Spinal stenosis, lumbar region with neurogenic claudication: Secondary | ICD-10-CM

## 2023-08-27 NOTE — Progress Notes (Signed)
   REFERRING PHYSICIAN:  Kandyce Rud, Md [address] [facility] [place],  [place] [address]  DOS: 12/12/22  L3-4 lumbar decompression including central laminectomy and bilateral medial facetectomies including foraminotomies   HISTORY OF PRESENT ILLNESS: TABETHIA TREGRE is status post L3-4 lumbar decompression.  She is doing extremely well.  Her leg pain is gone. She has some back pain with standing.   PHYSICAL EXAMINATION:  General: Patient is well developed, well nourished, calm, collected, and in no apparent distress.   NEUROLOGICAL:  General: In no acute distress.   Awake, alert, oriented to person, place, and time.  Pupils equal round and reactive to light.  Facial tone is symmetric.     Strength:            Side Iliopsoas Quads Hamstring PF DF EHL  R 5 5 5 5 5 5   L 5 5 5 5 5 5    Incision c/d/i   ROS (Neurologic):  Negative except as noted above  IMAGING: Nothing new to review.   ASSESSMENT/PLAN:  BUFF NELSON is doing very well s/p above surgery.  I am very pleased with her response to surgery.   I will see her back on an as-needed basis  Venetia Night MD Department of neurosurgery

## 2023-09-05 NOTE — Discharge Instructions (Signed)

## 2023-09-09 NOTE — Anesthesia Preprocedure Evaluation (Addendum)
Anesthesia Evaluation  Patient identified by MRN, date of birth, ID band Patient awake    Reviewed: Allergy & Precautions, H&P , NPO status , Patient's Chart, lab work & pertinent test results  History of Anesthesia Complications (+) Family history of anesthesia reaction  Airway Mallampati: III  TM Distance: >3 FB Neck ROM: Full    Dental no notable dental hx.    Pulmonary neg pulmonary ROS, former smoker   Pulmonary exam normal breath sounds clear to auscultation       Cardiovascular hypertension, negative cardio ROS Normal cardiovascular exam Rhythm:Regular Rate:Normal     Neuro/Psych negative neurological ROS  negative psych ROS   GI/Hepatic negative GI ROS, Neg liver ROS,GERD  ,,  Endo/Other  negative endocrine ROSHypothyroidism    Renal/GU negative Renal ROS  negative genitourinary   Musculoskeletal negative musculoskeletal ROS (+) Arthritis ,    Abdominal   Peds negative pediatric ROS (+)  Hematology negative hematology ROS (+)   Anesthesia Other Findings Hypertension  Hyperlipidemia GERD (gastroesophageal reflux disease) Barrett's esophagus Cancer  History of colonic polyps IBS (irritable bowel syndrome) Adenocarcinoma in situ Arthritis  Family history of adverse reaction to anesthesia--daughter PONV Hypothyroidism     Reproductive/Obstetrics negative OB ROS                             Anesthesia Physical Anesthesia Plan  ASA: 3  Anesthesia Plan: MAC   Post-op Pain Management:    Induction: Intravenous  PONV Risk Score and Plan:   Airway Management Planned: Natural Airway and Nasal Cannula  Additional Equipment:   Intra-op Plan:   Post-operative Plan:   Informed Consent: I have reviewed the patients History and Physical, chart, labs and discussed the procedure including the risks, benefits and alternatives for the proposed anesthesia with the patient or  authorized representative who has indicated his/her understanding and acceptance.     Dental Advisory Given  Plan Discussed with: Anesthesiologist, CRNA and Surgeon  Anesthesia Plan Comments: (Patient consented for risks of anesthesia including but not limited to:  - adverse reactions to medications - risk of airway placement if required - damage to eyes, teeth, lips or other oral mucosa - nerve damage due to positioning  - sore throat or hoarseness - Damage to heart, brain, nerves, lungs, other parts of body or loss of life  Patient voiced understanding and assent.)        Anesthesia Quick Evaluation

## 2023-09-10 ENCOUNTER — Ambulatory Visit: Payer: Medicare Other | Admitting: Anesthesiology

## 2023-09-10 ENCOUNTER — Ambulatory Visit
Admission: RE | Admit: 2023-09-10 | Discharge: 2023-09-10 | Disposition: A | Discharge status: Home / Self Care | Payer: Medicare Other | Attending: Ophthalmology | Admitting: Ophthalmology

## 2023-09-10 ENCOUNTER — Other Ambulatory Visit: Payer: Self-pay

## 2023-09-10 ENCOUNTER — Encounter
Admission: RE | Disposition: A | Discharge status: Home / Self Care | Payer: Self-pay | Source: Home / Self Care | Attending: Ophthalmology

## 2023-09-10 DIAGNOSIS — Z87891 Personal history of nicotine dependence: Secondary | ICD-10-CM | POA: Diagnosis not present

## 2023-09-10 DIAGNOSIS — E039 Hypothyroidism, unspecified: Secondary | ICD-10-CM | POA: Diagnosis not present

## 2023-09-10 DIAGNOSIS — K219 Gastro-esophageal reflux disease without esophagitis: Secondary | ICD-10-CM | POA: Insufficient documentation

## 2023-09-10 DIAGNOSIS — H2512 Age-related nuclear cataract, left eye: Secondary | ICD-10-CM | POA: Diagnosis present

## 2023-09-10 DIAGNOSIS — I10 Essential (primary) hypertension: Secondary | ICD-10-CM | POA: Insufficient documentation

## 2023-09-10 HISTORY — PX: CATARACT EXTRACTION W/PHACO: SHX586

## 2023-09-10 SURGERY — PHACOEMULSIFICATION, CATARACT, WITH IOL INSERTION
Anesthesia: Monitor Anesthesia Care | Laterality: Left

## 2023-09-10 MED ORDER — MIDAZOLAM HCL 2 MG/2ML IJ SOLN
INTRAMUSCULAR | Status: DC | PRN
  Administered 2023-09-10 (×2): 1 mg via INTRAVENOUS

## 2023-09-10 MED ORDER — TETRACAINE HCL 0.5 % OP SOLN
1.0000 [drp] | OPHTHALMIC | Status: DC | PRN
  Administered 2023-09-10 (×3): 1 [drp] via OPHTHALMIC

## 2023-09-10 MED ORDER — FENTANYL CITRATE (PF) 100 MCG/2ML IJ SOLN
INTRAMUSCULAR | Status: DC | PRN
  Administered 2023-09-10: 50 ug via INTRAVENOUS

## 2023-09-10 MED ORDER — TETRACAINE HCL 0.5 % OP SOLN
OPHTHALMIC | Status: AC
  Filled 2023-09-10: qty 4

## 2023-09-10 MED ORDER — FENTANYL CITRATE (PF) 100 MCG/2ML IJ SOLN
INTRAMUSCULAR | Status: AC
  Filled 2023-09-10: qty 2

## 2023-09-10 MED ORDER — ARMC OPHTHALMIC DILATING DROPS
OPHTHALMIC | Status: AC
  Filled 2023-09-10: qty 0.5

## 2023-09-10 MED ORDER — MIDAZOLAM HCL 2 MG/2ML IJ SOLN
INTRAMUSCULAR | Status: AC
  Filled 2023-09-10: qty 2

## 2023-09-10 MED ORDER — MOXIFLOXACIN HCL 0.5 % OP SOLN
OPHTHALMIC | Status: DC | PRN
  Administered 2023-09-10: 1 [drp] via OPHTHALMIC

## 2023-09-10 MED ORDER — SIGHTPATH DOSE#1 NA CHONDROIT SULF-NA HYALURON 40-17 MG/ML IO SOLN
INTRAOCULAR | Status: DC | PRN
  Administered 2023-09-10: 1 mL via INTRAOCULAR

## 2023-09-10 MED ORDER — SIGHTPATH DOSE#1 BSS IO SOLN
INTRAOCULAR | Status: DC | PRN
  Administered 2023-09-10: 2 mL

## 2023-09-10 MED ORDER — BRIMONIDINE TARTRATE-TIMOLOL 0.2-0.5 % OP SOLN
OPHTHALMIC | Status: DC | PRN
  Administered 2023-09-10: 1 [drp] via OPHTHALMIC

## 2023-09-10 MED ORDER — SIGHTPATH DOSE#1 BSS IO SOLN
INTRAOCULAR | Status: DC | PRN
  Administered 2023-09-10: 58 mL via OPHTHALMIC

## 2023-09-10 MED ORDER — ARMC OPHTHALMIC DILATING DROPS
1.0000 | OPHTHALMIC | Status: DC | PRN
  Administered 2023-09-10 (×3): 1 via OPHTHALMIC

## 2023-09-10 MED ORDER — SIGHTPATH DOSE#1 BSS IO SOLN
INTRAOCULAR | Status: DC | PRN
  Administered 2023-09-10: 15 mL via INTRAOCULAR

## 2023-09-10 SURGICAL SUPPLY — 10 items
CATARACT SUITE SIGHTPATH (MISCELLANEOUS) ×1
CYSTOTOME ANG REV CUT SHRT 25G (CUTTER) ×1
CYSTOTOME ANGL RVRS SHRT 25G (CUTTER) ×1 IMPLANT
FEE CATARACT SUITE SIGHTPATH (MISCELLANEOUS) ×1 IMPLANT
GLOVE BIOGEL PI IND STRL 8 (GLOVE) ×1 IMPLANT
GLOVE SURG LX STRL 8.0 MICRO (GLOVE) ×1 IMPLANT
LENS IOL TECNIS EYHANCE 23.0 (Intraocular Lens) IMPLANT
NDL FILTER BLUNT 18X1 1/2 (NEEDLE) ×1 IMPLANT
NEEDLE FILTER BLUNT 18X1 1/2 (NEEDLE) ×1
SYR 3ML LL SCALE MARK (SYRINGE) ×1 IMPLANT

## 2023-09-10 NOTE — H&P (Signed)
 [facility]   Primary Care Physician:  Diedra Lame, MD Ophthalmologist: Dr. Jaye  Pre-Procedure History & Physical: HPI:  Joy Hernandez is a 73 y.o. female here for cataract surgery.   Past Medical History:  Diagnosis Date   Adenocarcinoma in situ    appendix   Arthritis    Barrett's esophagus    Cancer (HCC)    appendix   Family history of adverse reaction to anesthesia    dtr PONV   GERD (gastroesophageal reflux disease)    History of colonic polyps    Hyperlipidemia    Hypertension    Hypothyroidism    IBS (irritable bowel syndrome)     Past Surgical History:  Procedure Laterality Date   APPENDECTOMY     BREAST BIOPSY Left 1990   needle bx- neg   CHOLECYSTECTOMY     COLONOSCOPY WITH PROPOFOL  N/A 09/02/2018   Procedure: COLONOSCOPY WITH PROPOFOL ;  Surgeon: Toledo, Ladell POUR, MD;  Location: [facility] ENDOSCOPY;  Service: Gastroenterology;  Laterality: N/A;   ESOPHAGOGASTRODUODENOSCOPY N/A 03/31/2015   Procedure: ESOPHAGOGASTRODUODENOSCOPY (EGD);  Surgeon: Deward CINDERELLA Piedmont, MD;  Location: [facility] END[facility]COPY;  Service: Gastroenterology;  Laterality: N/A;   ESOPHAGOGASTRODUODENOSCOPY (EGD) WITH PROPOFOL  N/A 09/02/2018   Procedure: ESOPHAGOGASTRODUODENOSCOPY (EGD) WITH PROPOFOL ;  Surgeon: Toledo, Ladell POUR, MD;  Location: [facility] ENDOSCOPY;  Service: Gastroenterology;  Laterality: N/A;   Excision compound atypical nevus left shoulder  08/19/2007   Left salpingectomy     LUMBAR LAMINECTOMY/DECOMPRESSION MICRODISCECTOMY N/A 12/12/2022   Procedure: L3-4 DECOMPRESSION;  Surgeon: Clois Fret, MD;  Location: [facility] ORS;  Service: Neurosurgery;  Laterality: N/A;   TUBAL LIGATION      Prior to Admission medications   Medication Sig Start Date End Date Taking? Authorizing Provider  Ascorbic Acid (VITAMIN C) 1000 MG tablet Take 1,000 mg by mouth daily.   Yes [provider]  bisoprolol-hydrochlorothiazide (ZIAC) 2.5-6.25 MG per tablet Take 1 tablet by mouth daily.   Yes  [provider]  cetirizine (ZYRTEC) 10 MG chewable tablet Chew 10 mg by mouth daily.   Yes [provider]  Cholecalciferol 50 MCG (2000 UT) CAPS Take 2,000 Units by mouth daily.   Yes [provider]  Cinnamon 500 MG capsule Take 500 mg by mouth daily.   Yes [provider]  Cranberry 500 MG CAPS Take by mouth daily.   Yes [provider]  levothyroxine (SYNTHROID) 50 MCG tablet Take 50 mcg by mouth daily.   Yes [provider]  MAGNESIUM GLYCINATE PO Take 500 mg by mouth daily.   Yes [provider]  Multiple Vitamin (MULTIVITAMIN) tablet Take 1 tablet by mouth daily.   Yes [provider]  omega-3 fish oil (MAXEPA) 1000 MG CAPS capsule Take 1 capsule by mouth daily.   Yes [provider]  pantoprazole (PROTONIX) 40 MG tablet Take 40 mg by mouth daily.   Yes [provider]  Probiotic Product (PROBIOTIC PO) Take by mouth daily.   Yes [provider]  simvastatin (ZOCOR) 40 MG tablet Take 40 mg by mouth daily.   Yes [provider]  Turmeric 500 MG CAPS Take by mouth daily.   Yes [provider]  vitamin B-12 (CYANOCOBALAMIN) 1000 MCG tablet Take 1,000 mcg by mouth daily.   Yes [provider]    Allergies as of 07/22/2023 - Review Complete 03/04/2023  Allergen Reaction Noted   Morphine and codeine  09/01/2018   Morphine sulfate Rash 07/07/2018   Sulfa antibiotics Other (See  Comments) and Rash 03/11/2014    Family History  Problem Relation Age of Onset   Breast cancer Sister 83    Social History   Socioeconomic History   Marital status: Married    Spouse name: Not on file   Number of children: Not on file   Years of education: Not on file   Highest education level: Not on file  Occupational History   Not on file  Tobacco Use   Smoking status: Former    Current packs/day: 0.00    Types: Cigarettes    Quit date: 01/04/2017    Years since quitting: 6.6    Smokeless tobacco: Never  Vaping Use   Vaping status: Never Used  Substance and Sexual Activity   Alcohol use: Not Currently   Drug use: Never   Sexual activity: Not on file  Other Topics Concern   Not on file  Social History Narrative   Not on file   Social Drivers of Health   Financial Resource Strain: Not on file  Food Insecurity: Not on file  Transportation Needs: Not on file  Physical Activity: Not on file  Stress: Not on file  Social Connections: Not on file  Intimate Partner Violence: Not on file    Review of Systems: See HPI, otherwise negative ROS  Physical Exam: BP (!) 177/77   Pulse 62   Temp (!) 97.2 F (36.2 C) (Temporal)   Resp 20   Ht 5' 3 (1.6 m)   Wt 90.3 kg   SpO2 98%   BMI 35.25 kg/m  General:   Alert, cooperative in NAD Head:  Normocephalic and atraumatic. Respiratory:  Normal work of breathing. Cardiovascular:  RRR  Impression/Plan: Joy Hernandez is here for cataract surgery.  Risks, benefits, limitations, and alternatives regarding cataract surgery have been reviewed with the patient.  Questions have been answered.  All parties agreeable.   Elsie Carmine, MD  09/10/2023, 7:47 AM

## 2023-09-10 NOTE — Op Note (Signed)
PREOPERATIVE DIAGNOSIS:  Nuclear sclerotic cataract of the left eye.   POSTOPERATIVE DIAGNOSIS:  Nuclear sclerotic cataract of the left eye.   OPERATIVE PROCEDURE:ORPROCALL@   SURGEON:  Galen Manila, MD.   ANESTHESIA:  Anesthesiologist: Marisue Humble, MD CRNA: Barbette Hair, CRNA  1.      Managed anesthesia care. 2.     0.72ml of Shugarcaine was instilled following the paracentesis   COMPLICATIONS:  None.   TECHNIQUE:   Stop and chop   DESCRIPTION OF PROCEDURE:  The patient was examined and consented in the preoperative holding area where the aforementioned topical anesthesia was applied to the left eye and then brought back to the Operating Room where the left eye was prepped and draped in the usual sterile ophthalmic fashion and a lid speculum was placed. A paracentesis was created with the side port blade and the anterior chamber was filled with viscoelastic. A near clear corneal incision was performed with the steel keratome. A continuous curvilinear capsulorrhexis was performed with a cystotome followed by the capsulorrhexis forceps. Hydrodissection and hydrodelineation were carried out with BSS on a blunt cannula. The lens was removed in a stop and chop  technique and the remaining cortical material was removed with the irrigation-aspiration handpiece. The capsular bag was inflated with viscoelastic and the Technis [id number] lens was placed in the capsular bag without complication. The remaining viscoelastic was removed from the eye with the irrigation-aspiration handpiece. The wounds were hydrated. The anterior chamber was flushed with BSS and the eye was inflated to physiologic pressure. 0.53ml Vigamox was placed in the anterior chamber. The wounds were found to be water tight. The eye was dressed with Combigan. The patient was given protective glasses to wear throughout the day and a shield with which to sleep tonight. The patient was also given drops with which to begin a drop regimen  today and will follow-up with me in one day. Implant Name Type Inv. Item Serial No. Manufacturer Lot No. LRB No. Used Action  LENS IOL TECNIS EYHANCE 23.0 - [id number] Intraocular Lens LENS IOL TECNIS EYHANCE 23.0 [id number] [employer]  Left 1 Implanted    Procedure(s): CATARACT EXTRACTION PHACO AND INTRAOCULAR LENS PLACEMENT (IOC) LEFT 6.63 00:49.0 (Left)  Electronically signed: Galen Manila 09/10/2023 8:08 AM

## 2023-09-10 NOTE — Anesthesia Postprocedure Evaluation (Signed)
 Anesthesia Post Note  Patient: Joy Hernandez  Procedure(s) Performed: CATARACT EXTRACTION PHACO AND INTRAOCULAR LENS PLACEMENT (IOC) LEFT 6.63 00:49.0 (Left)  Patient location during evaluation: PACU Anesthesia Type: MAC Level of consciousness: awake and alert Pain management: pain level controlled Vital Signs Assessment: post-procedure vital signs reviewed and stable Respiratory status: spontaneous breathing, nonlabored ventilation, respiratory function stable and patient connected to nasal cannula oxygen Cardiovascular status: stable and blood pressure returned to baseline Postop Assessment: no apparent nausea or vomiting Anesthetic complications: no   No notable events documented.   Last Vitals:  Vitals:   09/10/23 0808 09/10/23 0814  BP: 107/72 125/63  Pulse: 64 62  Resp: 15 17  Temp: (!) 36.1 C (!) 36.1 C  SpO2: 97% 97%    Last Pain:  Vitals:   09/10/23 0814  TempSrc:   PainSc: 0-No pain                 Donny JAYSON Mu

## 2023-09-10 NOTE — Transfer of Care (Signed)
 Immediate Anesthesia Transfer of Care Note  Patient: Joy Hernandez  Procedure(s) Performed: CATARACT EXTRACTION PHACO AND INTRAOCULAR LENS PLACEMENT (IOC) LEFT 6.63 00:49.0 (Left)  Patient Location: PACU  Anesthesia Type: MAC  Level of Consciousness: awake, alert  and patient cooperative  Airway and Oxygen Therapy: Patient Spontanous Breathing and Patient connected to supplemental oxygen  Post-op Assessment: Post-op Vital signs reviewed, Patient's Cardiovascular Status Stable, Respiratory Function Stable, Patent Airway and No signs of Nausea or vomiting  Post-op Vital Signs: Reviewed and stable  Complications: No notable events documented.

## 2023-09-11 ENCOUNTER — Ambulatory Visit: Payer: Medicare Other

## 2023-09-11 ENCOUNTER — Encounter: Payer: Self-pay | Admitting: Ophthalmology

## 2023-09-20 NOTE — Discharge Instructions (Signed)

## 2023-09-23 NOTE — Anesthesia Preprocedure Evaluation (Signed)
 Anesthesia Evaluation  Patient identified by MRN, date of birth, ID band Patient awake    Reviewed: Allergy & Precautions, H&P , NPO status , Patient's Chart, lab work & pertinent test results  History of Anesthesia Complications (+) Family history of anesthesia reaction  Airway Mallampati: III  TM Distance: >3 FB Neck ROM: Full    Dental no notable dental hx.    Pulmonary neg pulmonary ROS, former smoker   Pulmonary exam normal breath sounds clear to auscultation       Cardiovascular hypertension, negative cardio ROS Normal cardiovascular exam Rhythm:Regular Rate:Normal     Neuro/Psych negative neurological ROS  negative psych ROS   GI/Hepatic negative GI ROS, Neg liver ROS,GERD  ,,Barretts esophagus    Endo/Other  negative endocrine ROSHypothyroidism    Renal/GU negative Renal ROS  negative genitourinary   Musculoskeletal negative musculoskeletal ROS (+) Arthritis ,    Abdominal   Peds negative pediatric ROS (+)  Hematology negative hematology ROS (+)   Anesthesia Other Findings Previous cataract surgery 09-09-22 Had 2 mg versed IV and fentanyl 50 mcg IV  Hypertension  Hyperlipidemia GERD (gastroesophageal reflux disease) Barrett's esophagus Cancer  History of colonic polyps IBS (irritable bowel syndrome) Adenocarcinoma in situ Arthritis  Family history of adverse reaction to anesthesia Hypothyroidism  Neurogenic claudication due to lumbar spinal stenosis    Reproductive/Obstetrics negative OB ROS                             Anesthesia Physical Anesthesia Plan  ASA: 3  Anesthesia Plan: MAC   Post-op Pain Management:    Induction: Intravenous  PONV Risk Score and Plan:   Airway Management Planned: Natural Airway and Nasal Cannula  Additional Equipment:   Intra-op Plan:   Post-operative Plan:   Informed Consent: I have reviewed the patients History and  Physical, chart, labs and discussed the procedure including the risks, benefits and alternatives for the proposed anesthesia with the patient or authorized representative who has indicated his/her understanding and acceptance.     Dental Advisory Given  Plan Discussed with: Anesthesiologist, CRNA and Surgeon  Anesthesia Plan Comments: (Patient consented for risks of anesthesia including but not limited to:  - adverse reactions to medications - damage to eyes, teeth, lips or other oral mucosa - nerve damage due to positioning  - sore throat or hoarseness - Damage to heart, brain, nerves, lungs, other parts of body or loss of life  Patient voiced understanding and assent.)        Anesthesia Quick Evaluation

## 2023-09-24 ENCOUNTER — Ambulatory Visit: Payer: Medicare Other | Admitting: Anesthesiology

## 2023-09-24 ENCOUNTER — Other Ambulatory Visit: Payer: Self-pay

## 2023-09-24 ENCOUNTER — Encounter: Payer: Self-pay | Admitting: Ophthalmology

## 2023-09-24 ENCOUNTER — Encounter
Admission: RE | Disposition: A | Discharge status: Home / Self Care | Payer: Self-pay | Source: Home / Self Care | Attending: Ophthalmology

## 2023-09-24 ENCOUNTER — Ambulatory Visit
Admission: RE | Admit: 2023-09-24 | Discharge: 2023-09-24 | Disposition: A | Discharge status: Home / Self Care | Payer: Medicare Other | Attending: Ophthalmology | Admitting: Ophthalmology

## 2023-09-24 DIAGNOSIS — E039 Hypothyroidism, unspecified: Secondary | ICD-10-CM | POA: Insufficient documentation

## 2023-09-24 DIAGNOSIS — H2511 Age-related nuclear cataract, right eye: Secondary | ICD-10-CM | POA: Insufficient documentation

## 2023-09-24 DIAGNOSIS — K219 Gastro-esophageal reflux disease without esophagitis: Secondary | ICD-10-CM | POA: Insufficient documentation

## 2023-09-24 DIAGNOSIS — Z87891 Personal history of nicotine dependence: Secondary | ICD-10-CM | POA: Diagnosis not present

## 2023-09-24 HISTORY — DX: Spinal stenosis, lumbar region with neurogenic claudication: M48.062

## 2023-09-24 HISTORY — PX: CATARACT EXTRACTION W/PHACO: SHX586

## 2023-09-24 SURGERY — PHACOEMULSIFICATION, CATARACT, WITH IOL INSERTION
Anesthesia: Monitor Anesthesia Care | Laterality: Right

## 2023-09-24 MED ORDER — BRIMONIDINE TARTRATE-TIMOLOL 0.2-0.5 % OP SOLN
OPHTHALMIC | Status: DC | PRN
  Administered 2023-09-24: 1 [drp] via OPHTHALMIC

## 2023-09-24 MED ORDER — SIGHTPATH DOSE#1 BSS IO SOLN
INTRAOCULAR | Status: DC | PRN
  Administered 2023-09-24: 49 mL via OPHTHALMIC

## 2023-09-24 MED ORDER — MIDAZOLAM HCL 2 MG/2ML IJ SOLN
INTRAMUSCULAR | Status: AC
  Filled 2023-09-24: qty 2

## 2023-09-24 MED ORDER — MIDAZOLAM HCL 2 MG/2ML IJ SOLN
INTRAMUSCULAR | Status: DC | PRN
  Administered 2023-09-24: 1 mg via INTRAVENOUS

## 2023-09-24 MED ORDER — SIGHTPATH DOSE#1 BSS IO SOLN
INTRAOCULAR | Status: DC | PRN
  Administered 2023-09-24: 2 mL

## 2023-09-24 MED ORDER — SIGHTPATH DOSE#1 NA CHONDROIT SULF-NA HYALURON 40-17 MG/ML IO SOLN
INTRAOCULAR | Status: DC | PRN
  Administered 2023-09-24: 1 mL via INTRAOCULAR

## 2023-09-24 MED ORDER — FENTANYL CITRATE (PF) 100 MCG/2ML IJ SOLN
INTRAMUSCULAR | Status: AC
  Filled 2023-09-24: qty 2

## 2023-09-24 MED ORDER — SIGHTPATH DOSE#1 BSS IO SOLN
INTRAOCULAR | Status: DC | PRN
  Administered 2023-09-24: 15 mL via INTRAOCULAR

## 2023-09-24 MED ORDER — ARMC OPHTHALMIC DILATING DROPS
1.0000 | OPHTHALMIC | Status: DC | PRN
  Administered 2023-09-24 (×3): 1 via OPHTHALMIC

## 2023-09-24 MED ORDER — FENTANYL CITRATE (PF) 100 MCG/2ML IJ SOLN
INTRAMUSCULAR | Status: DC | PRN
  Administered 2023-09-24: 50 ug via INTRAVENOUS

## 2023-09-24 MED ORDER — MOXIFLOXACIN HCL 0.5 % OP SOLN
OPHTHALMIC | Status: DC | PRN
  Administered 2023-09-24: .2 mL via OPHTHALMIC

## 2023-09-24 MED ORDER — TETRACAINE HCL 0.5 % OP SOLN
1.0000 [drp] | OPHTHALMIC | Status: DC | PRN
  Administered 2023-09-24 (×3): 1 [drp] via OPHTHALMIC

## 2023-09-24 MED ORDER — TETRACAINE HCL 0.5 % OP SOLN
OPHTHALMIC | Status: AC
  Filled 2023-09-24: qty 4

## 2023-09-24 MED ORDER — ARMC OPHTHALMIC DILATING DROPS
OPHTHALMIC | Status: AC
  Filled 2023-09-24: qty 0.5

## 2023-09-24 SURGICAL SUPPLY — 10 items
CATARACT SUITE SIGHTPATH (MISCELLANEOUS) ×1 IMPLANT
CYSTOTOME ANG REV CUT SHRT 25G (CUTTER) ×1 IMPLANT
CYSTOTOME ANGL RVRS SHRT 25G (CUTTER) ×1 IMPLANT
FEE CATARACT SUITE SIGHTPATH (MISCELLANEOUS) ×1 IMPLANT
GLOVE BIOGEL PI IND STRL 8 (GLOVE) ×1 IMPLANT
GLOVE SURG LX STRL 8.0 MICRO (GLOVE) ×1 IMPLANT
LENS IOL TECNIS EYHANCE 22.5 (Intraocular Lens) IMPLANT
NDL FILTER BLUNT 18X1 1/2 (NEEDLE) ×1 IMPLANT
NEEDLE FILTER BLUNT 18X1 1/2 (NEEDLE) ×1 IMPLANT
SYR 3ML LL SCALE MARK (SYRINGE) ×1 IMPLANT

## 2023-09-24 NOTE — H&P (Signed)
  [facility]   Primary Care Physician:  Kandyce Rud, MD Ophthalmologist: Dr. Maren Reamer  Pre-Procedure History & Physical: HPI:  Joy Hernandez is a 73 y.o. female here for cataract surgery.   Past Medical History:  Diagnosis Date   Adenocarcinoma in situ    appendix   Arthritis    Barrett's esophagus    Cancer (HCC)    appendix   Family history of adverse reaction to anesthesia    dtr PONV   GERD (gastroesophageal reflux disease)    History of colonic polyps    Hyperlipidemia    Hypertension    Hypothyroidism    IBS (irritable bowel syndrome)    Neurogenic claudication due to lumbar spinal stenosis     Past Surgical History:  Procedure Laterality Date   APPENDECTOMY     BREAST BIOPSY Left 1990   needle bx- neg   CATARACT EXTRACTION W/PHACO Left 09/10/2023   Procedure: CATARACT EXTRACTION PHACO AND INTRAOCULAR LENS PLACEMENT (IOC) LEFT 6.63 00:49.0;  Surgeon: Galen Manila, MD;  Location: [facility];  Service: Ophthalmology;  Laterality: Left;   CHOLECYSTECTOMY     COLONOSCOPY WITH PROPOFOL N/A 09/02/2018   Procedure: COLONOSCOPY WITH PROPOFOL;  Surgeon: Toledo, Boykin Nearing, MD;  Location: [facility];  Service: Gastroenterology;  Laterality: N/A;   ESOPHAGOGASTRODUODENOSCOPY N/A 03/31/2015   Procedure: ESOPHAGOGASTRODUODENOSCOPY (EGD);  Surgeon: Wallace Cullens, MD;  Location: [facility];  Service: Gastroenterology;  Laterality: N/A;   ESOPHAGOGASTRODUODENOSCOPY (EGD) WITH PROPOFOL N/A 09/02/2018   Procedure: ESOPHAGOGASTRODUODENOSCOPY (EGD) WITH PROPOFOL;  Surgeon: Toledo, Boykin Nearing, MD;  Location: [facility] ENDOSCOPY;  Service: Gastroenterology;  Laterality: N/A;   Excision compound atypical nevus left shoulder  08/19/2007   Left salpingectomy     LUMBAR LAMINECTOMY/DECOMPRESSION MICRODISCECTOMY N/A 12/12/2022   Procedure: L3-4 DECOMPRESSION;  Surgeon: Venetia Night, MD;  Location: [facility];  Service: Neurosurgery;  Laterality: N/A;   TUBAL LIGATION       Prior to Admission medications   Medication Sig Start Date End Date Taking? Authorizing Provider  Ascorbic Acid (VITAMIN C) 1000 MG tablet Take 1,000 mg by mouth daily.   Yes [provider]  bisoprolol-hydrochlorothiazide (ZIAC) 2.5-6.25 MG per tablet Take 1 tablet by mouth daily.   Yes [provider]  cetirizine (ZYRTEC) 10 MG chewable tablet Chew 10 mg by mouth daily.   Yes [provider]  Cholecalciferol 50 MCG (2000 UT) CAPS Take 2,000 Units by mouth daily.   Yes [provider]  Cinnamon 500 MG capsule Take 500 mg by mouth daily.   Yes [provider]  Cranberry 500 MG CAPS Take by mouth daily.   Yes [provider]  levothyroxine (SYNTHROID) 50 MCG tablet Take 50 mcg by mouth daily.   Yes [provider]  MAGNESIUM GLYCINATE PO Take 500 mg by mouth daily.   Yes [provider]  Multiple Vitamin (MULTIVITAMIN) tablet Take 1 tablet by mouth daily.   Yes [provider]  omega-3 fish oil (MAXEPA) 1000 MG CAPS capsule Take 1 capsule by mouth daily.   Yes [provider]  pantoprazole (PROTONIX) 40 MG tablet Take 40 mg by mouth daily.   Yes [provider]  Probiotic Product (PROBIOTIC PO) Take by mouth daily.   Yes [provider]  simvastatin (ZOCOR) 40 MG tablet Take 40 mg by mouth daily.   Yes [provider]  Turmeric 500 MG CAPS Take by mouth daily.   Yes [provider]  vitamin B-12 (CYANOCOBALAMIN) 1000 MCG  tablet Take 1,000 mcg by mouth daily.   Yes [provider]    Allergies as of 07/22/2023 - Review Complete 03/04/2023  Allergen Reaction Noted   Morphine and codeine  09/01/2018   Morphine sulfate Rash 07/07/2018   Sulfa antibiotics Other (See Comments) and Rash 03/11/2014    Family History  Problem Relation Age of Onset   Breast cancer Sister 83    Social History   Socioeconomic History   Marital status: Married    Spouse  name: Not on file   Number of children: Not on file   Years of education: Not on file   Highest education level: Not on file  Occupational History   Not on file  Tobacco Use   Smoking status: Former    Current packs/day: 0.00    Types: Cigarettes    Quit date: 01/04/2017    Years since quitting: 6.7   Smokeless tobacco: Never  Vaping Use   Vaping status: Never Used  Substance and Sexual Activity   Alcohol use: Not Currently   Drug use: Never   Sexual activity: Not on file  Other Topics Concern   Not on file  Social History Narrative   Not on file   Social Drivers of Health   Financial Resource Strain: Not on file  Food Insecurity: Not on file  Transportation Needs: Not on file  Physical Activity: Not on file  Stress: Not on file  Social Connections: Not on file  Intimate Partner Violence: Not on file    Review of Systems: See HPI, otherwise negative ROS  Physical Exam: BP (!) 186/77   Pulse 60   Temp (!) 97.3 F (36.3 C) (Temporal)   Resp 18   Ht 5\' 3"  (1.6 m)   Wt 90.5 kg   SpO2 97%   BMI 35.36 kg/m  General:   Alert, cooperative in NAD Head:  Normocephalic and atraumatic. Respiratory:  Normal work of breathing. Cardiovascular:  RRR  Impression/Plan: Joy Hernandez is here for cataract surgery.  Risks, benefits, limitations, and alternatives regarding cataract surgery have been reviewed with the patient.  Questions have been answered.  All parties agreeable.   Galen Manila, MD  09/24/2023, 8:04 AM

## 2023-09-24 NOTE — Op Note (Signed)
 PREOPERATIVE DIAGNOSIS:  Nuclear sclerotic cataract of the right eye.   POSTOPERATIVE DIAGNOSIS:  Cataract   OPERATIVE PROCEDURE:ORPROCALL@   SURGEON:  Joy Manila, MD.   ANESTHESIA:  Anesthesiologist: Marisue Humble, MD CRNA: Lanell Matar, CRNA  1.      Managed anesthesia care. 2.      0.34ml of Shugarcaine was instilled in the eye following the paracentesis.   COMPLICATIONS:  None.   TECHNIQUE:   Stop and chop   DESCRIPTION OF PROCEDURE:  The patient was examined and consented in the preoperative holding area where the aforementioned topical anesthesia was applied to the right eye and then brought back to the Operating Room where the right eye was prepped and draped in the usual sterile ophthalmic fashion and a lid speculum was placed. A paracentesis was created with the side port blade and the anterior chamber was filled with viscoelastic. A near clear corneal incision was performed with the steel keratome. A continuous curvilinear capsulorrhexis was performed with a cystotome followed by the capsulorrhexis forceps. Hydrodissection and hydrodelineation were carried out with BSS on a blunt cannula. The lens was removed in a stop and chop  technique and the remaining cortical material was removed with the irrigation-aspiration handpiece. The capsular bag was inflated with viscoelastic and the Technis [id number]  lens was placed in the capsular bag without complication. The remaining viscoelastic was removed from the eye with the irrigation-aspiration handpiece. The wounds were hydrated. The anterior chamber was flushed with BSS and the eye was inflated to physiologic pressure. 0.64ml of Vigamox was placed in the anterior chamber. The wounds were found to be water tight. The eye was dressed with Combigan. The patient was given protective glasses to wear throughout the day and a shield with which to sleep tonight. The patient was also given drops with which to begin a drop regimen today and  will follow-up with me in one day. Implant Name Type Inv. Item Serial No. Manufacturer Lot No. LRB No. Used Action  LENS IOL TECNIS EYHANCE 22.5 - [id number] Intraocular Lens LENS IOL TECNIS EYHANCE 22.5 [id number] [employer]  Right 1 Implanted   Procedure(s): CATARACT EXTRACTION PHACO AND INTRAOCULAR LENS PLACEMENT (IOC) RIGHT 6.92 00:44.5 (Right)  Electronically signed: Galen Hernandez 09/24/2023 8:25 AM

## 2023-09-24 NOTE — Transfer of Care (Signed)
 Immediate Anesthesia Transfer of Care Note  Patient: Joy Hernandez  Procedure(s) Performed: CATARACT EXTRACTION PHACO AND INTRAOCULAR LENS PLACEMENT (IOC) RIGHT 6.92 00:44.5 (Right)  Patient Location: PACU  Anesthesia Type:MAC  Level of Consciousness: awake, alert , and oriented  Airway & Oxygen Therapy: Patient Spontanous Breathing  Post-op Assessment: Report given to RN, Post -op Vital signs reviewed and stable, and Patient moving all extremities X 4  Post vital signs: Reviewed and stable  Last Vitals:  Vitals Value Taken Time  BP    Temp    Pulse 108 09/24/23 0826  Resp 18 09/24/23 0826  SpO2 100 % 09/24/23 0826  Vitals shown include unfiled device data.  Last Pain:  Vitals:   09/24/23 0826  TempSrc:   PainSc: (P) 0-No pain         Complications: No notable events documented.

## 2023-09-24 NOTE — Anesthesia Postprocedure Evaluation (Signed)
 Anesthesia Post Note  Patient: Joy Hernandez  Procedure(s) Performed: CATARACT EXTRACTION PHACO AND INTRAOCULAR LENS PLACEMENT (IOC) RIGHT 6.92 00:44.5 (Right)  Patient location during evaluation: PACU Anesthesia Type: MAC Level of consciousness: awake and alert Pain management: pain level controlled Vital Signs Assessment: post-procedure vital signs reviewed and stable Respiratory status: spontaneous breathing, nonlabored ventilation, respiratory function stable and patient connected to nasal cannula oxygen Cardiovascular status: stable and blood pressure returned to baseline Postop Assessment: no apparent nausea or vomiting Anesthetic complications: no   No notable events documented.   Last Vitals:  Vitals:   09/24/23 0826 09/24/23 0832  BP: (!) 159/79 (!) 175/74  Pulse: (!) 108 (!) 51  Resp: 18 15  Temp: (!) 36.1 C (!) 36.2 C  SpO2: 100% 96%    Last Pain:  Vitals:   09/24/23 0826  TempSrc:   PainSc: 0-No pain                 Marisue Humble

## 2023-09-25 ENCOUNTER — Encounter: Payer: Self-pay | Admitting: Ophthalmology

## 2023-12-31 ENCOUNTER — Other Ambulatory Visit: Payer: Self-pay | Admitting: Family Medicine

## 2023-12-31 DIAGNOSIS — N9489 Other specified conditions associated with female genital organs and menstrual cycle: Secondary | ICD-10-CM

## 2024-01-09 ENCOUNTER — Ambulatory Visit
Admission: RE | Admit: 2024-01-09 | Discharge: 2024-01-09 | Disposition: A | Discharge status: Home / Self Care | Source: Ambulatory Visit | Attending: Family Medicine | Admitting: Family Medicine

## 2024-01-09 DIAGNOSIS — N9489 Other specified conditions associated with female genital organs and menstrual cycle: Secondary | ICD-10-CM

## 2024-04-03 ENCOUNTER — Other Ambulatory Visit (HOSPITAL_COMMUNITY): Payer: Self-pay

## 2024-07-06 DEATH — deceased

## 2024-08-11 ENCOUNTER — Other Ambulatory Visit: Payer: Self-pay | Admitting: Family Medicine

## 2024-08-11 DIAGNOSIS — Z1231 Encounter for screening mammogram for malignant neoplasm of breast: Secondary | ICD-10-CM

## 2024-09-10 ENCOUNTER — Ambulatory Visit

## 2024-10-08 ENCOUNTER — Ambulatory Visit
